# Patient Record
Sex: Female | Born: 1975 | Race: Black or African American | Hispanic: No | Marital: Single | Smoking: Never smoker
Health system: Southern US, Community
[De-identification: ages and names within clinical notes are randomized; demographics above are authoritative.]

## PROBLEM LIST (undated history)

## (undated) ENCOUNTER — Emergency Department (HOSPITAL_COMMUNITY): Discharge status: Against Medical Advice | Payer: Self-pay

## (undated) DIAGNOSIS — I10 Essential (primary) hypertension: Secondary | ICD-10-CM

## (undated) HISTORY — DX: Essential (primary) hypertension: I10

## (undated) HISTORY — PX: TMJ ARTHROPLASTY: SHX1066

---

## 1999-07-25 ENCOUNTER — Encounter: Payer: Self-pay | Admitting: *Deleted

## 1999-07-25 ENCOUNTER — Encounter: Admission: RE | Admit: 1999-07-25 | Discharge: 1999-07-25 | Payer: Self-pay | Admitting: *Deleted

## 2001-03-14 ENCOUNTER — Other Ambulatory Visit: Admission: RE | Admit: 2001-03-14 | Discharge: 2001-03-14 | Payer: Self-pay | Admitting: Obstetrics and Gynecology

## 2003-08-30 ENCOUNTER — Emergency Department (HOSPITAL_COMMUNITY): Admission: EM | Admit: 2003-08-30 | Discharge: 2003-08-30 | Payer: Self-pay | Admitting: Emergency Medicine

## 2004-09-30 ENCOUNTER — Ambulatory Visit (HOSPITAL_COMMUNITY): Admission: RE | Admit: 2004-09-30 | Discharge: 2004-09-30 | Payer: Self-pay | Admitting: Obstetrics and Gynecology

## 2005-05-19 ENCOUNTER — Inpatient Hospital Stay (HOSPITAL_COMMUNITY): Admission: AD | Admit: 2005-05-19 | Discharge: 2005-05-22 | Payer: Self-pay | Admitting: Obstetrics & Gynecology

## 2005-05-23 ENCOUNTER — Encounter: Admission: RE | Admit: 2005-05-23 | Discharge: 2005-06-21 | Payer: Self-pay | Admitting: Obstetrics and Gynecology

## 2005-06-22 ENCOUNTER — Encounter: Admission: RE | Admit: 2005-06-22 | Discharge: 2005-07-22 | Payer: Self-pay | Admitting: Obstetrics and Gynecology

## 2005-07-23 ENCOUNTER — Encounter: Admission: RE | Admit: 2005-07-23 | Discharge: 2005-08-21 | Payer: Self-pay | Admitting: Obstetrics and Gynecology

## 2005-08-22 ENCOUNTER — Encounter: Admission: RE | Admit: 2005-08-22 | Discharge: 2005-09-21 | Payer: Self-pay | Admitting: Obstetrics and Gynecology

## 2005-09-22 ENCOUNTER — Encounter: Admission: RE | Admit: 2005-09-22 | Discharge: 2005-10-22 | Payer: Self-pay | Admitting: Obstetrics and Gynecology

## 2005-10-23 ENCOUNTER — Encounter: Admission: RE | Admit: 2005-10-23 | Discharge: 2005-11-19 | Payer: Self-pay | Admitting: Obstetrics and Gynecology

## 2005-11-20 ENCOUNTER — Encounter: Admission: RE | Admit: 2005-11-20 | Discharge: 2005-12-20 | Payer: Self-pay | Admitting: Obstetrics and Gynecology

## 2005-12-21 ENCOUNTER — Encounter: Admission: RE | Admit: 2005-12-21 | Discharge: 2006-01-19 | Payer: Self-pay | Admitting: Obstetrics and Gynecology

## 2006-01-20 ENCOUNTER — Encounter: Admission: RE | Admit: 2006-01-20 | Discharge: 2006-02-19 | Payer: Self-pay | Admitting: Obstetrics and Gynecology

## 2006-02-20 ENCOUNTER — Encounter: Admission: RE | Admit: 2006-02-20 | Discharge: 2006-03-21 | Payer: Self-pay | Admitting: Obstetrics and Gynecology

## 2006-03-22 ENCOUNTER — Encounter: Admission: RE | Admit: 2006-03-22 | Discharge: 2006-04-21 | Payer: Self-pay | Admitting: Obstetrics and Gynecology

## 2006-04-22 ENCOUNTER — Encounter: Admission: RE | Admit: 2006-04-22 | Discharge: 2006-05-22 | Payer: Self-pay | Admitting: Obstetrics and Gynecology

## 2006-12-02 IMAGING — US US OB COMP LESS 14 WK
1 series · 18 of 28 positions shown · non-contrast
Comparison: none

CLINICAL DATA: 7 week 2 day gestational age by LMP.  Persistent vaginal bleeding.  
 OBSTETRICAL ULTRASOUND WITH TRANSVAGINAL:
 A single living intrauterine gestation is seen with measured heart rate of 118.  Embryonic crown rump length measures 6 mm, corresponding with a gestational age of 6 weeks 3 days.  A normal appearing yolk sac is seen.  A moderate sized subchorionic hemorrhage is seen along the left lateral aspect of the gestational sac.  No fibroids or other uterine abnormalities are seen.
 No adnexal masses or free fluid are identified by either transabdominal or transvaginal sonography.  The left ovary is normal in appearance.  The right ovary contains a corpus luteum cyst measuring approximately 1.7 cm.

[Series 1: us ob comp<14 wk · 18 of 31 slices shown]
[im 1/31]
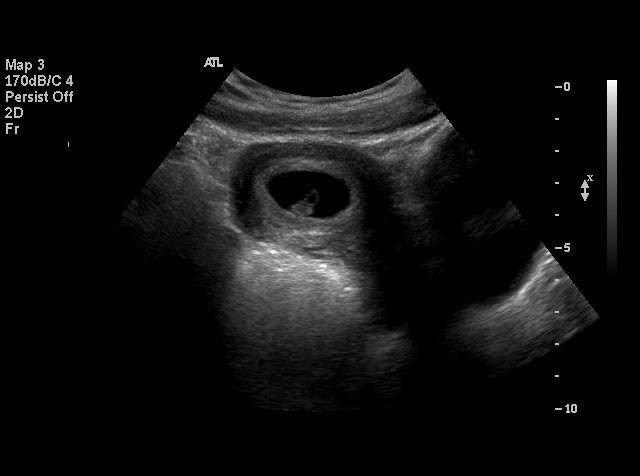
[im 3/31]
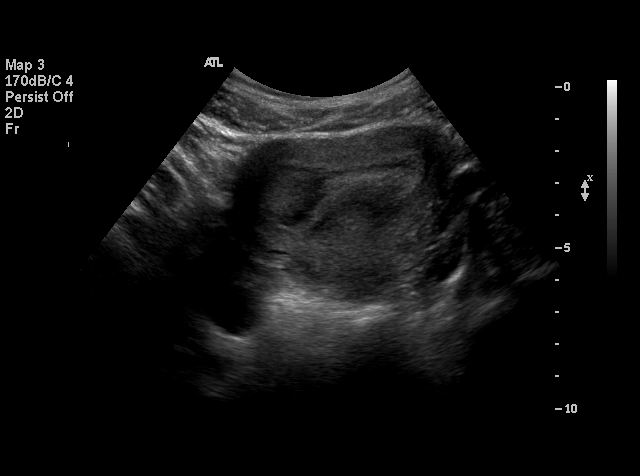
[im 4/31]
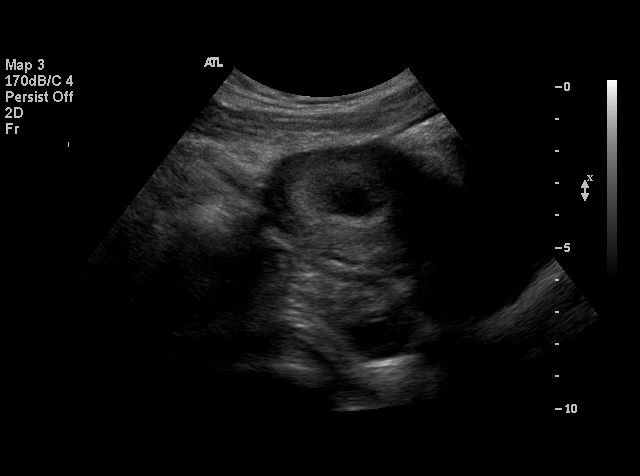
[im 6/31]
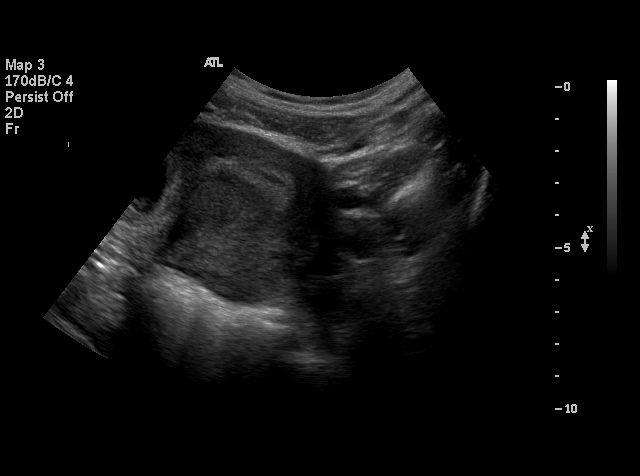
[im 8/31]
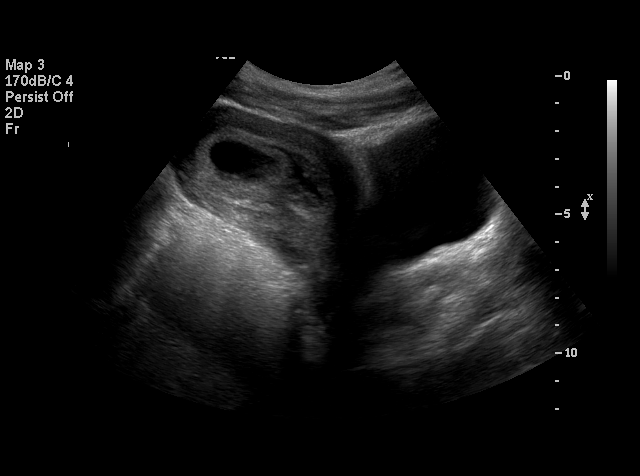
[im 9/31]
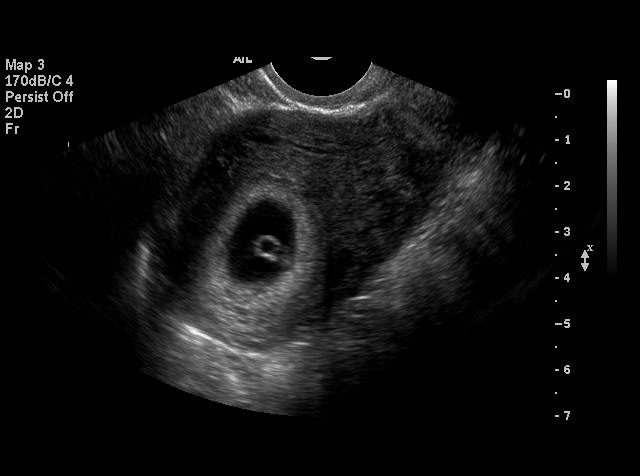
[im 12/31]
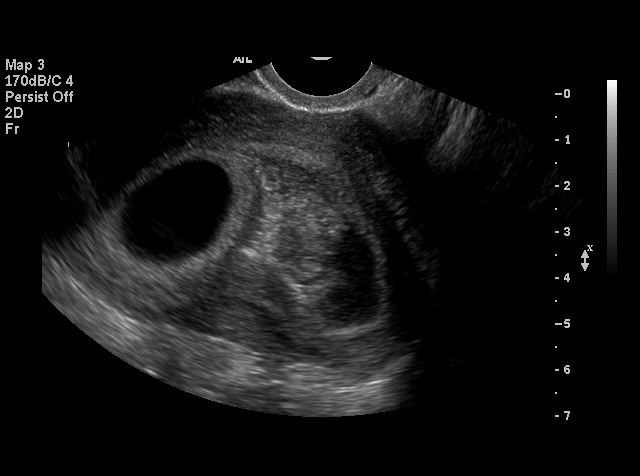
[im 13/31]
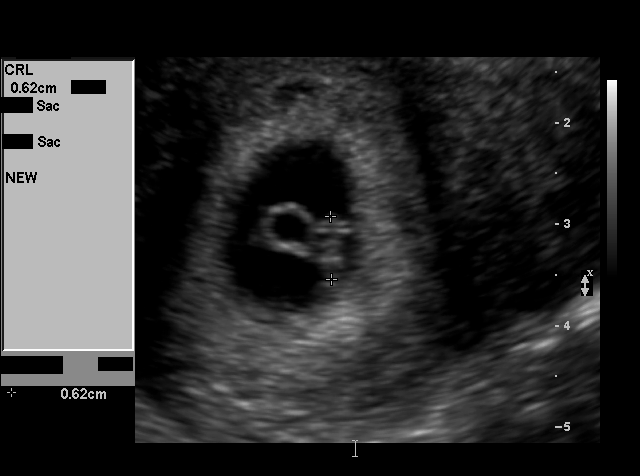
[im 15/31]
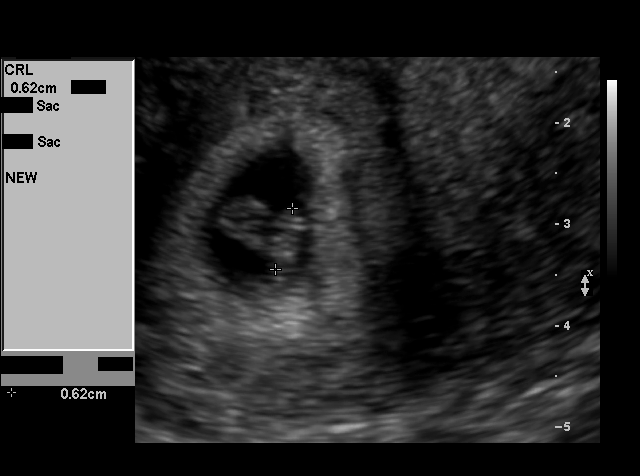
[im 16/31]
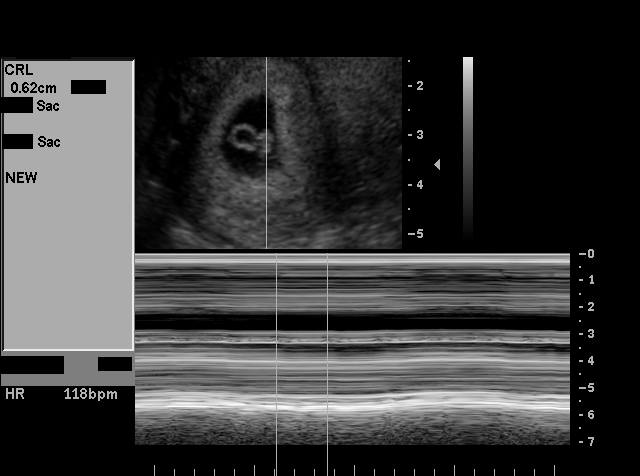
[im 18/31]
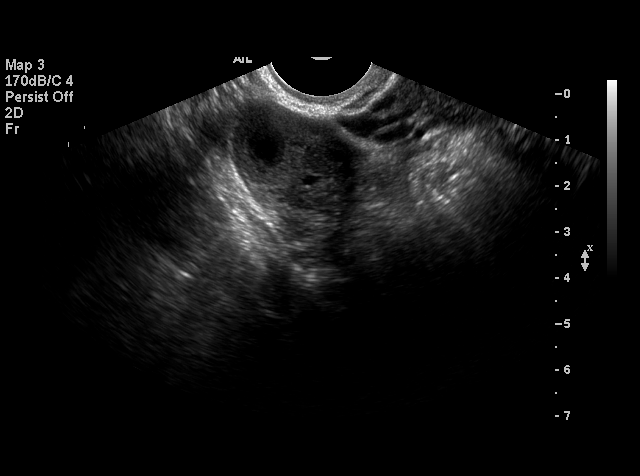
[im 19/31]
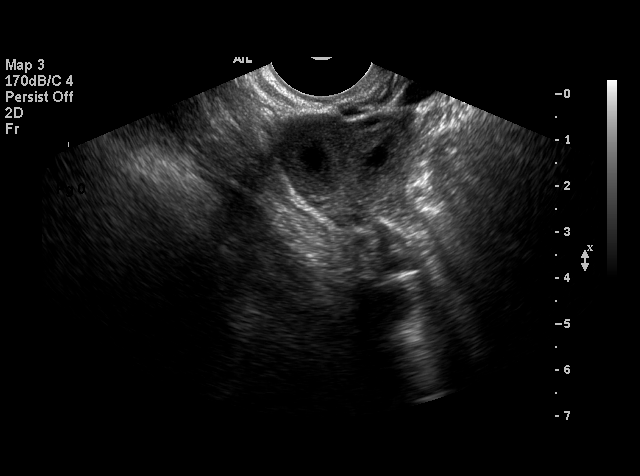
[im 22/31]
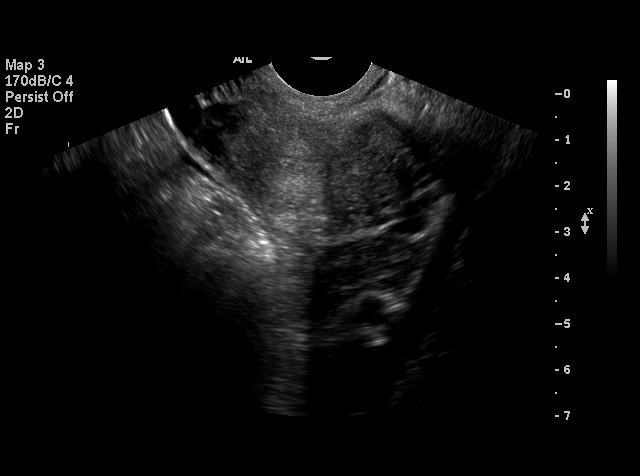
[im 24/31]
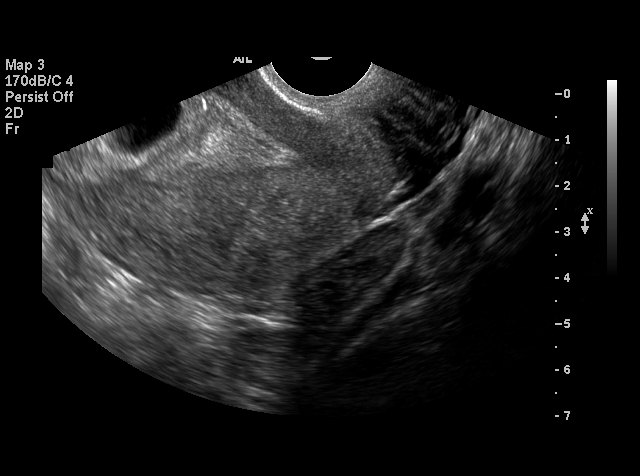
[im 25/31]
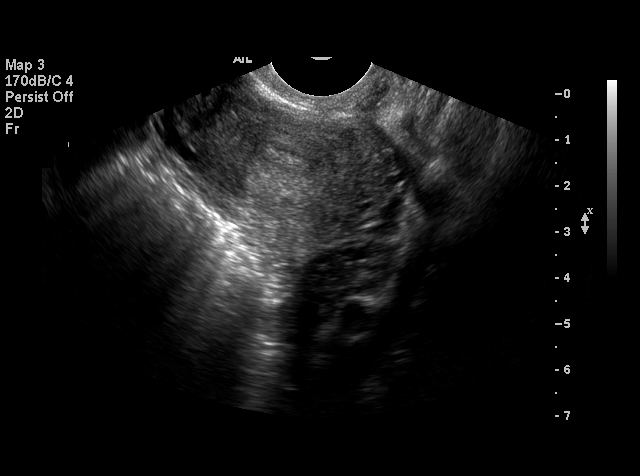
[im 27/31]
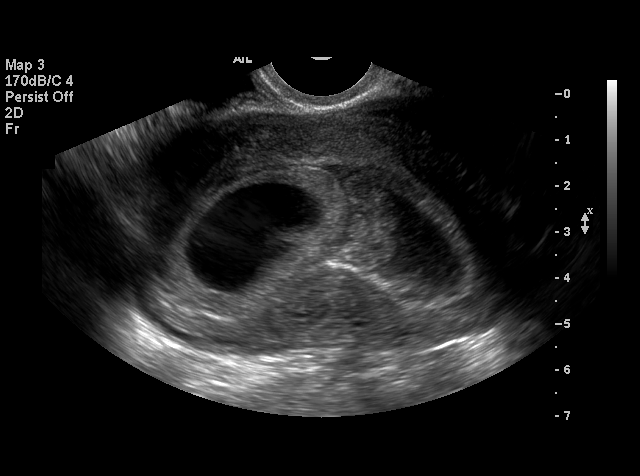
[im 28/31]
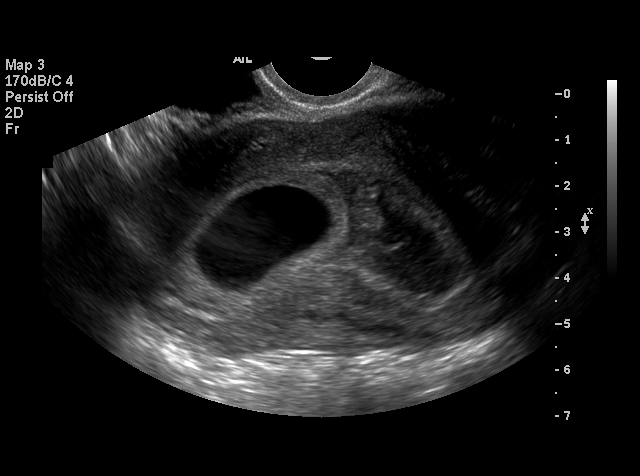
[im 31/31]
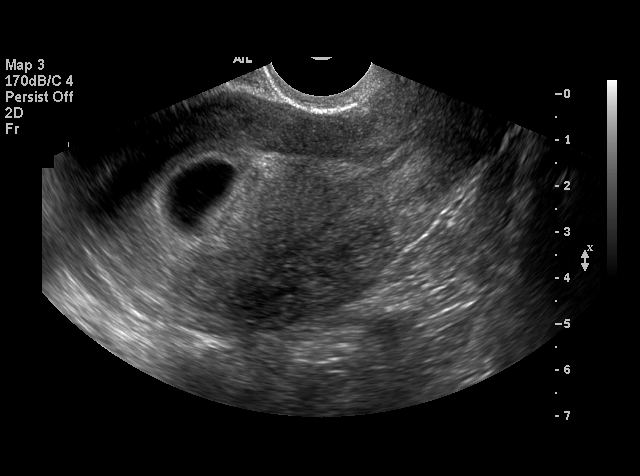

[18 of 28 positions shown; findings below may reference images not displayed]

IMPRESSION: 1.  Single living intrauterine gestation with estimated gestational age of 6 weeks 3 days and sonographic EDC of 05/23/05.  This is within one week of LMP.
 2.  Moderate subchorionic hemorrhage noted.

## 2020-06-03 ENCOUNTER — Other Ambulatory Visit: Payer: Self-pay

## 2020-06-03 ENCOUNTER — Ambulatory Visit
Admission: RE | Admit: 2020-06-03 | Discharge: 2020-06-03 | Disposition: A | Discharge status: Home / Self Care | Payer: No Typology Code available for payment source | Source: Ambulatory Visit | Attending: Obstetrics and Gynecology | Admitting: Obstetrics and Gynecology

## 2020-06-03 ENCOUNTER — Other Ambulatory Visit: Payer: Self-pay | Admitting: Obstetrics and Gynecology

## 2020-06-03 DIAGNOSIS — Z111 Encounter for screening for respiratory tuberculosis: Secondary | ICD-10-CM

## 2020-07-06 DIAGNOSIS — I10 Essential (primary) hypertension: Secondary | ICD-10-CM | POA: Diagnosis not present

## 2020-07-06 DIAGNOSIS — N951 Menopausal and female climacteric states: Secondary | ICD-10-CM | POA: Diagnosis not present

## 2020-07-16 DIAGNOSIS — Z6841 Body Mass Index (BMI) 40.0 and over, adult: Secondary | ICD-10-CM | POA: Diagnosis not present

## 2020-07-16 DIAGNOSIS — N951 Menopausal and female climacteric states: Secondary | ICD-10-CM | POA: Diagnosis not present

## 2020-07-16 DIAGNOSIS — I10 Essential (primary) hypertension: Secondary | ICD-10-CM | POA: Diagnosis not present

## 2020-07-16 DIAGNOSIS — Z1339 Encounter for screening examination for other mental health and behavioral disorders: Secondary | ICD-10-CM | POA: Diagnosis not present

## 2020-07-16 DIAGNOSIS — Z1331 Encounter for screening for depression: Secondary | ICD-10-CM | POA: Diagnosis not present

## 2020-07-23 DIAGNOSIS — I10 Essential (primary) hypertension: Secondary | ICD-10-CM | POA: Diagnosis not present

## 2020-07-23 DIAGNOSIS — J3081 Allergic rhinitis due to animal (cat) (dog) hair and dander: Secondary | ICD-10-CM | POA: Diagnosis not present

## 2020-07-23 DIAGNOSIS — Z6841 Body Mass Index (BMI) 40.0 and over, adult: Secondary | ICD-10-CM | POA: Diagnosis not present

## 2020-07-23 DIAGNOSIS — J301 Allergic rhinitis due to pollen: Secondary | ICD-10-CM | POA: Diagnosis not present

## 2020-07-23 DIAGNOSIS — J3089 Other allergic rhinitis: Secondary | ICD-10-CM | POA: Diagnosis not present

## 2020-07-30 DIAGNOSIS — I10 Essential (primary) hypertension: Secondary | ICD-10-CM | POA: Diagnosis not present

## 2020-07-30 DIAGNOSIS — J3089 Other allergic rhinitis: Secondary | ICD-10-CM | POA: Diagnosis not present

## 2020-07-30 DIAGNOSIS — J301 Allergic rhinitis due to pollen: Secondary | ICD-10-CM | POA: Diagnosis not present

## 2020-07-30 DIAGNOSIS — J3081 Allergic rhinitis due to animal (cat) (dog) hair and dander: Secondary | ICD-10-CM | POA: Diagnosis not present

## 2020-07-30 DIAGNOSIS — Z6841 Body Mass Index (BMI) 40.0 and over, adult: Secondary | ICD-10-CM | POA: Diagnosis not present

## 2020-08-06 DIAGNOSIS — Z6841 Body Mass Index (BMI) 40.0 and over, adult: Secondary | ICD-10-CM | POA: Diagnosis not present

## 2020-08-06 DIAGNOSIS — I10 Essential (primary) hypertension: Secondary | ICD-10-CM | POA: Diagnosis not present

## 2020-08-11 DIAGNOSIS — J3089 Other allergic rhinitis: Secondary | ICD-10-CM | POA: Diagnosis not present

## 2020-08-11 DIAGNOSIS — Z6841 Body Mass Index (BMI) 40.0 and over, adult: Secondary | ICD-10-CM | POA: Diagnosis not present

## 2020-08-11 DIAGNOSIS — I10 Essential (primary) hypertension: Secondary | ICD-10-CM | POA: Diagnosis not present

## 2020-08-11 DIAGNOSIS — J3081 Allergic rhinitis due to animal (cat) (dog) hair and dander: Secondary | ICD-10-CM | POA: Diagnosis not present

## 2020-08-11 DIAGNOSIS — J301 Allergic rhinitis due to pollen: Secondary | ICD-10-CM | POA: Diagnosis not present

## 2020-08-19 DIAGNOSIS — J3089 Other allergic rhinitis: Secondary | ICD-10-CM | POA: Diagnosis not present

## 2020-08-19 DIAGNOSIS — J3081 Allergic rhinitis due to animal (cat) (dog) hair and dander: Secondary | ICD-10-CM | POA: Diagnosis not present

## 2020-08-19 DIAGNOSIS — J301 Allergic rhinitis due to pollen: Secondary | ICD-10-CM | POA: Diagnosis not present

## 2020-08-26 DIAGNOSIS — J301 Allergic rhinitis due to pollen: Secondary | ICD-10-CM | POA: Diagnosis not present

## 2020-08-26 DIAGNOSIS — J3081 Allergic rhinitis due to animal (cat) (dog) hair and dander: Secondary | ICD-10-CM | POA: Diagnosis not present

## 2020-08-26 DIAGNOSIS — J3089 Other allergic rhinitis: Secondary | ICD-10-CM | POA: Diagnosis not present

## 2020-08-27 DIAGNOSIS — N951 Menopausal and female climacteric states: Secondary | ICD-10-CM | POA: Diagnosis not present

## 2020-08-27 DIAGNOSIS — I10 Essential (primary) hypertension: Secondary | ICD-10-CM | POA: Diagnosis not present

## 2020-09-03 DIAGNOSIS — I10 Essential (primary) hypertension: Secondary | ICD-10-CM | POA: Diagnosis not present

## 2020-09-03 DIAGNOSIS — N951 Menopausal and female climacteric states: Secondary | ICD-10-CM | POA: Diagnosis not present

## 2020-09-09 DIAGNOSIS — J301 Allergic rhinitis due to pollen: Secondary | ICD-10-CM | POA: Diagnosis not present

## 2020-09-09 DIAGNOSIS — J3089 Other allergic rhinitis: Secondary | ICD-10-CM | POA: Diagnosis not present

## 2020-09-09 DIAGNOSIS — N951 Menopausal and female climacteric states: Secondary | ICD-10-CM | POA: Diagnosis not present

## 2020-09-09 DIAGNOSIS — J3081 Allergic rhinitis due to animal (cat) (dog) hair and dander: Secondary | ICD-10-CM | POA: Diagnosis not present

## 2020-09-09 DIAGNOSIS — I10 Essential (primary) hypertension: Secondary | ICD-10-CM | POA: Diagnosis not present

## 2020-09-16 DIAGNOSIS — J3081 Allergic rhinitis due to animal (cat) (dog) hair and dander: Secondary | ICD-10-CM | POA: Diagnosis not present

## 2020-09-16 DIAGNOSIS — J301 Allergic rhinitis due to pollen: Secondary | ICD-10-CM | POA: Diagnosis not present

## 2020-09-16 DIAGNOSIS — J3089 Other allergic rhinitis: Secondary | ICD-10-CM | POA: Diagnosis not present

## 2020-09-24 DIAGNOSIS — I10 Essential (primary) hypertension: Secondary | ICD-10-CM | POA: Diagnosis not present

## 2020-09-24 DIAGNOSIS — J3089 Other allergic rhinitis: Secondary | ICD-10-CM | POA: Diagnosis not present

## 2020-09-24 DIAGNOSIS — J3081 Allergic rhinitis due to animal (cat) (dog) hair and dander: Secondary | ICD-10-CM | POA: Diagnosis not present

## 2020-09-24 DIAGNOSIS — Z6841 Body Mass Index (BMI) 40.0 and over, adult: Secondary | ICD-10-CM | POA: Diagnosis not present

## 2020-09-24 DIAGNOSIS — J301 Allergic rhinitis due to pollen: Secondary | ICD-10-CM | POA: Diagnosis not present

## 2020-10-01 DIAGNOSIS — J301 Allergic rhinitis due to pollen: Secondary | ICD-10-CM | POA: Diagnosis not present

## 2020-10-01 DIAGNOSIS — J3089 Other allergic rhinitis: Secondary | ICD-10-CM | POA: Diagnosis not present

## 2020-10-01 DIAGNOSIS — J3081 Allergic rhinitis due to animal (cat) (dog) hair and dander: Secondary | ICD-10-CM | POA: Diagnosis not present

## 2020-10-15 DIAGNOSIS — J3089 Other allergic rhinitis: Secondary | ICD-10-CM | POA: Diagnosis not present

## 2020-10-15 DIAGNOSIS — J3081 Allergic rhinitis due to animal (cat) (dog) hair and dander: Secondary | ICD-10-CM | POA: Diagnosis not present

## 2020-10-15 DIAGNOSIS — J301 Allergic rhinitis due to pollen: Secondary | ICD-10-CM | POA: Diagnosis not present

## 2020-10-29 DIAGNOSIS — J301 Allergic rhinitis due to pollen: Secondary | ICD-10-CM | POA: Diagnosis not present

## 2020-10-29 DIAGNOSIS — J3089 Other allergic rhinitis: Secondary | ICD-10-CM | POA: Diagnosis not present

## 2020-10-29 DIAGNOSIS — J3081 Allergic rhinitis due to animal (cat) (dog) hair and dander: Secondary | ICD-10-CM | POA: Diagnosis not present

## 2020-11-03 DIAGNOSIS — J3089 Other allergic rhinitis: Secondary | ICD-10-CM | POA: Diagnosis not present

## 2020-11-03 DIAGNOSIS — J301 Allergic rhinitis due to pollen: Secondary | ICD-10-CM | POA: Diagnosis not present

## 2020-11-03 DIAGNOSIS — J3081 Allergic rhinitis due to animal (cat) (dog) hair and dander: Secondary | ICD-10-CM | POA: Diagnosis not present

## 2020-11-12 DIAGNOSIS — J3089 Other allergic rhinitis: Secondary | ICD-10-CM | POA: Diagnosis not present

## 2020-11-12 DIAGNOSIS — J301 Allergic rhinitis due to pollen: Secondary | ICD-10-CM | POA: Diagnosis not present

## 2020-11-12 DIAGNOSIS — J3081 Allergic rhinitis due to animal (cat) (dog) hair and dander: Secondary | ICD-10-CM | POA: Diagnosis not present

## 2020-11-25 DIAGNOSIS — J3089 Other allergic rhinitis: Secondary | ICD-10-CM | POA: Diagnosis not present

## 2020-11-25 DIAGNOSIS — J301 Allergic rhinitis due to pollen: Secondary | ICD-10-CM | POA: Diagnosis not present

## 2020-11-25 DIAGNOSIS — J3081 Allergic rhinitis due to animal (cat) (dog) hair and dander: Secondary | ICD-10-CM | POA: Diagnosis not present

## 2020-12-02 DIAGNOSIS — J3089 Other allergic rhinitis: Secondary | ICD-10-CM | POA: Diagnosis not present

## 2020-12-02 DIAGNOSIS — J301 Allergic rhinitis due to pollen: Secondary | ICD-10-CM | POA: Diagnosis not present

## 2020-12-02 DIAGNOSIS — J3081 Allergic rhinitis due to animal (cat) (dog) hair and dander: Secondary | ICD-10-CM | POA: Diagnosis not present

## 2020-12-17 DIAGNOSIS — J301 Allergic rhinitis due to pollen: Secondary | ICD-10-CM | POA: Diagnosis not present

## 2020-12-17 DIAGNOSIS — J3089 Other allergic rhinitis: Secondary | ICD-10-CM | POA: Diagnosis not present

## 2020-12-17 DIAGNOSIS — J3081 Allergic rhinitis due to animal (cat) (dog) hair and dander: Secondary | ICD-10-CM | POA: Diagnosis not present

## 2020-12-24 DIAGNOSIS — J3081 Allergic rhinitis due to animal (cat) (dog) hair and dander: Secondary | ICD-10-CM | POA: Diagnosis not present

## 2020-12-24 DIAGNOSIS — J301 Allergic rhinitis due to pollen: Secondary | ICD-10-CM | POA: Diagnosis not present

## 2020-12-24 DIAGNOSIS — J3089 Other allergic rhinitis: Secondary | ICD-10-CM | POA: Diagnosis not present

## 2021-01-05 DIAGNOSIS — J3081 Allergic rhinitis due to animal (cat) (dog) hair and dander: Secondary | ICD-10-CM | POA: Diagnosis not present

## 2021-01-05 DIAGNOSIS — J301 Allergic rhinitis due to pollen: Secondary | ICD-10-CM | POA: Diagnosis not present

## 2021-01-05 DIAGNOSIS — J3089 Other allergic rhinitis: Secondary | ICD-10-CM | POA: Diagnosis not present

## 2021-01-05 DIAGNOSIS — L500 Allergic urticaria: Secondary | ICD-10-CM | POA: Diagnosis not present

## 2021-01-19 DIAGNOSIS — J301 Allergic rhinitis due to pollen: Secondary | ICD-10-CM | POA: Diagnosis not present

## 2021-01-19 DIAGNOSIS — J3081 Allergic rhinitis due to animal (cat) (dog) hair and dander: Secondary | ICD-10-CM | POA: Diagnosis not present

## 2021-01-20 DIAGNOSIS — J3089 Other allergic rhinitis: Secondary | ICD-10-CM | POA: Diagnosis not present

## 2021-01-21 DIAGNOSIS — J3081 Allergic rhinitis due to animal (cat) (dog) hair and dander: Secondary | ICD-10-CM | POA: Diagnosis not present

## 2021-01-21 DIAGNOSIS — J301 Allergic rhinitis due to pollen: Secondary | ICD-10-CM | POA: Diagnosis not present

## 2021-01-21 DIAGNOSIS — J3089 Other allergic rhinitis: Secondary | ICD-10-CM | POA: Diagnosis not present

## 2021-03-02 DIAGNOSIS — J3081 Allergic rhinitis due to animal (cat) (dog) hair and dander: Secondary | ICD-10-CM | POA: Diagnosis not present

## 2021-03-02 DIAGNOSIS — J301 Allergic rhinitis due to pollen: Secondary | ICD-10-CM | POA: Diagnosis not present

## 2021-03-02 DIAGNOSIS — J3089 Other allergic rhinitis: Secondary | ICD-10-CM | POA: Diagnosis not present

## 2021-03-09 DIAGNOSIS — J3081 Allergic rhinitis due to animal (cat) (dog) hair and dander: Secondary | ICD-10-CM | POA: Diagnosis not present

## 2021-03-09 DIAGNOSIS — J301 Allergic rhinitis due to pollen: Secondary | ICD-10-CM | POA: Diagnosis not present

## 2021-03-09 DIAGNOSIS — J3089 Other allergic rhinitis: Secondary | ICD-10-CM | POA: Diagnosis not present

## 2021-03-16 DIAGNOSIS — J3089 Other allergic rhinitis: Secondary | ICD-10-CM | POA: Diagnosis not present

## 2021-03-16 DIAGNOSIS — J3081 Allergic rhinitis due to animal (cat) (dog) hair and dander: Secondary | ICD-10-CM | POA: Diagnosis not present

## 2021-03-16 DIAGNOSIS — J301 Allergic rhinitis due to pollen: Secondary | ICD-10-CM | POA: Diagnosis not present

## 2021-04-08 DIAGNOSIS — J3089 Other allergic rhinitis: Secondary | ICD-10-CM | POA: Diagnosis not present

## 2021-04-08 DIAGNOSIS — J301 Allergic rhinitis due to pollen: Secondary | ICD-10-CM | POA: Diagnosis not present

## 2021-04-08 DIAGNOSIS — J3081 Allergic rhinitis due to animal (cat) (dog) hair and dander: Secondary | ICD-10-CM | POA: Diagnosis not present

## 2021-04-14 DIAGNOSIS — J301 Allergic rhinitis due to pollen: Secondary | ICD-10-CM | POA: Diagnosis not present

## 2021-04-14 DIAGNOSIS — J3089 Other allergic rhinitis: Secondary | ICD-10-CM | POA: Diagnosis not present

## 2021-04-14 DIAGNOSIS — J3081 Allergic rhinitis due to animal (cat) (dog) hair and dander: Secondary | ICD-10-CM | POA: Diagnosis not present

## 2021-04-25 DIAGNOSIS — J3081 Allergic rhinitis due to animal (cat) (dog) hair and dander: Secondary | ICD-10-CM | POA: Diagnosis not present

## 2021-04-25 DIAGNOSIS — J301 Allergic rhinitis due to pollen: Secondary | ICD-10-CM | POA: Diagnosis not present

## 2021-04-25 DIAGNOSIS — J3089 Other allergic rhinitis: Secondary | ICD-10-CM | POA: Diagnosis not present

## 2021-05-02 DIAGNOSIS — J3089 Other allergic rhinitis: Secondary | ICD-10-CM | POA: Diagnosis not present

## 2021-05-02 DIAGNOSIS — J301 Allergic rhinitis due to pollen: Secondary | ICD-10-CM | POA: Diagnosis not present

## 2021-05-02 DIAGNOSIS — J3081 Allergic rhinitis due to animal (cat) (dog) hair and dander: Secondary | ICD-10-CM | POA: Diagnosis not present

## 2021-05-18 DIAGNOSIS — J301 Allergic rhinitis due to pollen: Secondary | ICD-10-CM | POA: Diagnosis not present

## 2021-05-18 DIAGNOSIS — J3089 Other allergic rhinitis: Secondary | ICD-10-CM | POA: Diagnosis not present

## 2021-05-18 DIAGNOSIS — J3081 Allergic rhinitis due to animal (cat) (dog) hair and dander: Secondary | ICD-10-CM | POA: Diagnosis not present

## 2021-05-25 DIAGNOSIS — J3081 Allergic rhinitis due to animal (cat) (dog) hair and dander: Secondary | ICD-10-CM | POA: Diagnosis not present

## 2021-05-25 DIAGNOSIS — J301 Allergic rhinitis due to pollen: Secondary | ICD-10-CM | POA: Diagnosis not present

## 2021-05-25 DIAGNOSIS — J3089 Other allergic rhinitis: Secondary | ICD-10-CM | POA: Diagnosis not present

## 2021-06-13 DIAGNOSIS — J3081 Allergic rhinitis due to animal (cat) (dog) hair and dander: Secondary | ICD-10-CM | POA: Diagnosis not present

## 2021-06-13 DIAGNOSIS — J301 Allergic rhinitis due to pollen: Secondary | ICD-10-CM | POA: Diagnosis not present

## 2021-06-13 DIAGNOSIS — Z1231 Encounter for screening mammogram for malignant neoplasm of breast: Secondary | ICD-10-CM | POA: Diagnosis not present

## 2021-06-13 DIAGNOSIS — J3089 Other allergic rhinitis: Secondary | ICD-10-CM | POA: Diagnosis not present

## 2021-06-16 DIAGNOSIS — Z01419 Encounter for gynecological examination (general) (routine) without abnormal findings: Secondary | ICD-10-CM | POA: Diagnosis not present

## 2021-06-16 DIAGNOSIS — Z30431 Encounter for routine checking of intrauterine contraceptive device: Secondary | ICD-10-CM | POA: Diagnosis not present

## 2021-06-16 DIAGNOSIS — Z30014 Encounter for initial prescription of intrauterine contraceptive device: Secondary | ICD-10-CM | POA: Diagnosis not present

## 2021-06-16 DIAGNOSIS — I10 Essential (primary) hypertension: Secondary | ICD-10-CM | POA: Diagnosis not present

## 2021-06-20 DIAGNOSIS — J3089 Other allergic rhinitis: Secondary | ICD-10-CM | POA: Diagnosis not present

## 2021-06-20 DIAGNOSIS — J301 Allergic rhinitis due to pollen: Secondary | ICD-10-CM | POA: Diagnosis not present

## 2021-06-20 DIAGNOSIS — J3081 Allergic rhinitis due to animal (cat) (dog) hair and dander: Secondary | ICD-10-CM | POA: Diagnosis not present

## 2021-06-29 DIAGNOSIS — T8332XA Displacement of intrauterine contraceptive device, initial encounter: Secondary | ICD-10-CM | POA: Diagnosis not present

## 2021-06-29 DIAGNOSIS — Z30433 Encounter for removal and reinsertion of intrauterine contraceptive device: Secondary | ICD-10-CM | POA: Diagnosis not present

## 2021-06-29 DIAGNOSIS — D251 Intramural leiomyoma of uterus: Secondary | ICD-10-CM | POA: Diagnosis not present

## 2021-07-06 DIAGNOSIS — J3089 Other allergic rhinitis: Secondary | ICD-10-CM | POA: Diagnosis not present

## 2021-07-06 DIAGNOSIS — J301 Allergic rhinitis due to pollen: Secondary | ICD-10-CM | POA: Diagnosis not present

## 2021-07-06 DIAGNOSIS — J3081 Allergic rhinitis due to animal (cat) (dog) hair and dander: Secondary | ICD-10-CM | POA: Diagnosis not present

## 2021-07-13 DIAGNOSIS — J3081 Allergic rhinitis due to animal (cat) (dog) hair and dander: Secondary | ICD-10-CM | POA: Diagnosis not present

## 2021-07-13 DIAGNOSIS — J301 Allergic rhinitis due to pollen: Secondary | ICD-10-CM | POA: Diagnosis not present

## 2021-07-13 DIAGNOSIS — J3089 Other allergic rhinitis: Secondary | ICD-10-CM | POA: Diagnosis not present

## 2021-07-18 DIAGNOSIS — R252 Cramp and spasm: Secondary | ICD-10-CM | POA: Diagnosis not present

## 2021-07-18 DIAGNOSIS — R000 Tachycardia, unspecified: Secondary | ICD-10-CM | POA: Diagnosis not present

## 2021-07-18 DIAGNOSIS — Z13220 Encounter for screening for lipoid disorders: Secondary | ICD-10-CM | POA: Diagnosis not present

## 2021-07-18 DIAGNOSIS — I10 Essential (primary) hypertension: Secondary | ICD-10-CM | POA: Diagnosis not present

## 2021-07-18 DIAGNOSIS — Z23 Encounter for immunization: Secondary | ICD-10-CM | POA: Diagnosis not present

## 2021-07-29 DIAGNOSIS — J301 Allergic rhinitis due to pollen: Secondary | ICD-10-CM | POA: Diagnosis not present

## 2021-07-29 DIAGNOSIS — J3089 Other allergic rhinitis: Secondary | ICD-10-CM | POA: Diagnosis not present

## 2021-07-29 DIAGNOSIS — J3081 Allergic rhinitis due to animal (cat) (dog) hair and dander: Secondary | ICD-10-CM | POA: Diagnosis not present

## 2021-08-04 DIAGNOSIS — Z30431 Encounter for routine checking of intrauterine contraceptive device: Secondary | ICD-10-CM | POA: Diagnosis not present

## 2021-08-04 DIAGNOSIS — I10 Essential (primary) hypertension: Secondary | ICD-10-CM | POA: Diagnosis not present

## 2021-08-17 DIAGNOSIS — J3081 Allergic rhinitis due to animal (cat) (dog) hair and dander: Secondary | ICD-10-CM | POA: Diagnosis not present

## 2021-08-17 DIAGNOSIS — J3089 Other allergic rhinitis: Secondary | ICD-10-CM | POA: Diagnosis not present

## 2021-08-17 DIAGNOSIS — J301 Allergic rhinitis due to pollen: Secondary | ICD-10-CM | POA: Diagnosis not present

## 2021-08-26 DIAGNOSIS — J3081 Allergic rhinitis due to animal (cat) (dog) hair and dander: Secondary | ICD-10-CM | POA: Diagnosis not present

## 2021-08-26 DIAGNOSIS — J3089 Other allergic rhinitis: Secondary | ICD-10-CM | POA: Diagnosis not present

## 2021-08-26 DIAGNOSIS — J301 Allergic rhinitis due to pollen: Secondary | ICD-10-CM | POA: Diagnosis not present

## 2021-09-02 DIAGNOSIS — J3081 Allergic rhinitis due to animal (cat) (dog) hair and dander: Secondary | ICD-10-CM | POA: Diagnosis not present

## 2021-09-02 DIAGNOSIS — J3089 Other allergic rhinitis: Secondary | ICD-10-CM | POA: Diagnosis not present

## 2021-09-02 DIAGNOSIS — J301 Allergic rhinitis due to pollen: Secondary | ICD-10-CM | POA: Diagnosis not present

## 2021-09-16 DIAGNOSIS — J3081 Allergic rhinitis due to animal (cat) (dog) hair and dander: Secondary | ICD-10-CM | POA: Diagnosis not present

## 2021-09-16 DIAGNOSIS — J3089 Other allergic rhinitis: Secondary | ICD-10-CM | POA: Diagnosis not present

## 2021-09-16 DIAGNOSIS — J301 Allergic rhinitis due to pollen: Secondary | ICD-10-CM | POA: Diagnosis not present

## 2021-09-23 DIAGNOSIS — J301 Allergic rhinitis due to pollen: Secondary | ICD-10-CM | POA: Diagnosis not present

## 2021-09-23 DIAGNOSIS — J3089 Other allergic rhinitis: Secondary | ICD-10-CM | POA: Diagnosis not present

## 2021-09-23 DIAGNOSIS — J3081 Allergic rhinitis due to animal (cat) (dog) hair and dander: Secondary | ICD-10-CM | POA: Diagnosis not present

## 2021-09-30 DIAGNOSIS — J3089 Other allergic rhinitis: Secondary | ICD-10-CM | POA: Diagnosis not present

## 2021-09-30 DIAGNOSIS — J301 Allergic rhinitis due to pollen: Secondary | ICD-10-CM | POA: Diagnosis not present

## 2021-09-30 DIAGNOSIS — J3081 Allergic rhinitis due to animal (cat) (dog) hair and dander: Secondary | ICD-10-CM | POA: Diagnosis not present

## 2021-10-12 DIAGNOSIS — J301 Allergic rhinitis due to pollen: Secondary | ICD-10-CM | POA: Diagnosis not present

## 2021-10-12 DIAGNOSIS — J3089 Other allergic rhinitis: Secondary | ICD-10-CM | POA: Diagnosis not present

## 2021-10-12 DIAGNOSIS — J3081 Allergic rhinitis due to animal (cat) (dog) hair and dander: Secondary | ICD-10-CM | POA: Diagnosis not present

## 2021-10-20 DIAGNOSIS — J3081 Allergic rhinitis due to animal (cat) (dog) hair and dander: Secondary | ICD-10-CM | POA: Diagnosis not present

## 2021-10-20 DIAGNOSIS — J301 Allergic rhinitis due to pollen: Secondary | ICD-10-CM | POA: Diagnosis not present

## 2021-10-20 DIAGNOSIS — J3089 Other allergic rhinitis: Secondary | ICD-10-CM | POA: Diagnosis not present

## 2021-10-26 ENCOUNTER — Ambulatory Visit (HOSPITAL_BASED_OUTPATIENT_CLINIC_OR_DEPARTMENT_OTHER): Payer: BC Managed Care – PPO | Admitting: Cardiovascular Disease

## 2021-11-07 DIAGNOSIS — J3089 Other allergic rhinitis: Secondary | ICD-10-CM | POA: Diagnosis not present

## 2021-11-07 DIAGNOSIS — J3081 Allergic rhinitis due to animal (cat) (dog) hair and dander: Secondary | ICD-10-CM | POA: Diagnosis not present

## 2021-11-07 DIAGNOSIS — J301 Allergic rhinitis due to pollen: Secondary | ICD-10-CM | POA: Diagnosis not present

## 2021-11-16 DIAGNOSIS — J301 Allergic rhinitis due to pollen: Secondary | ICD-10-CM | POA: Diagnosis not present

## 2021-11-16 DIAGNOSIS — J3081 Allergic rhinitis due to animal (cat) (dog) hair and dander: Secondary | ICD-10-CM | POA: Diagnosis not present

## 2021-11-17 DIAGNOSIS — J3089 Other allergic rhinitis: Secondary | ICD-10-CM | POA: Diagnosis not present

## 2021-11-24 DIAGNOSIS — J301 Allergic rhinitis due to pollen: Secondary | ICD-10-CM | POA: Diagnosis not present

## 2021-11-24 DIAGNOSIS — J3089 Other allergic rhinitis: Secondary | ICD-10-CM | POA: Diagnosis not present

## 2021-11-24 DIAGNOSIS — J3081 Allergic rhinitis due to animal (cat) (dog) hair and dander: Secondary | ICD-10-CM | POA: Diagnosis not present

## 2021-11-28 ENCOUNTER — Ambulatory Visit (HOSPITAL_BASED_OUTPATIENT_CLINIC_OR_DEPARTMENT_OTHER): Payer: BC Managed Care – PPO | Admitting: Cardiovascular Disease

## 2021-12-26 DIAGNOSIS — J3081 Allergic rhinitis due to animal (cat) (dog) hair and dander: Secondary | ICD-10-CM | POA: Diagnosis not present

## 2021-12-26 DIAGNOSIS — J301 Allergic rhinitis due to pollen: Secondary | ICD-10-CM | POA: Diagnosis not present

## 2021-12-26 DIAGNOSIS — J3089 Other allergic rhinitis: Secondary | ICD-10-CM | POA: Diagnosis not present

## 2022-01-16 DIAGNOSIS — I10 Essential (primary) hypertension: Secondary | ICD-10-CM | POA: Diagnosis not present

## 2022-01-16 DIAGNOSIS — J3089 Other allergic rhinitis: Secondary | ICD-10-CM | POA: Diagnosis not present

## 2022-01-16 DIAGNOSIS — Z0000 Encounter for general adult medical examination without abnormal findings: Secondary | ICD-10-CM | POA: Diagnosis not present

## 2022-01-16 DIAGNOSIS — J301 Allergic rhinitis due to pollen: Secondary | ICD-10-CM | POA: Diagnosis not present

## 2022-01-16 DIAGNOSIS — J3081 Allergic rhinitis due to animal (cat) (dog) hair and dander: Secondary | ICD-10-CM | POA: Diagnosis not present

## 2022-01-27 ENCOUNTER — Encounter (HOSPITAL_BASED_OUTPATIENT_CLINIC_OR_DEPARTMENT_OTHER): Payer: Self-pay | Admitting: Cardiovascular Disease

## 2022-01-27 ENCOUNTER — Ambulatory Visit (HOSPITAL_BASED_OUTPATIENT_CLINIC_OR_DEPARTMENT_OTHER): Payer: BC Managed Care – PPO | Admitting: Cardiovascular Disease

## 2022-01-27 VITALS — BP 157/119 | HR 108 | Ht 62.0 in | Wt 254.6 lb

## 2022-01-27 DIAGNOSIS — I10 Essential (primary) hypertension: Secondary | ICD-10-CM

## 2022-01-27 DIAGNOSIS — R000 Tachycardia, unspecified: Secondary | ICD-10-CM

## 2022-01-27 DIAGNOSIS — J3089 Other allergic rhinitis: Secondary | ICD-10-CM | POA: Diagnosis not present

## 2022-01-27 DIAGNOSIS — Z006 Encounter for examination for normal comparison and control in clinical research program: Secondary | ICD-10-CM

## 2022-01-27 DIAGNOSIS — J301 Allergic rhinitis due to pollen: Secondary | ICD-10-CM | POA: Diagnosis not present

## 2022-01-27 DIAGNOSIS — J3081 Allergic rhinitis due to animal (cat) (dog) hair and dander: Secondary | ICD-10-CM | POA: Diagnosis not present

## 2022-01-27 HISTORY — DX: Essential (primary) hypertension: I10

## 2022-01-27 HISTORY — DX: Tachycardia, unspecified: R00.0

## 2022-01-27 HISTORY — DX: Morbid (severe) obesity due to excess calories: E66.01

## 2022-01-27 MED ORDER — HYDROCHLOROTHIAZIDE 12.5 MG PO CAPS: 12.5000 mg | ORAL_CAPSULE | Freq: Every day | ORAL | 3 refills | Status: DC

## 2022-01-27 MED ORDER — AMLODIPINE BESYLATE 5 MG PO TABS: 5.0000 mg | ORAL_TABLET | Freq: Every day | ORAL | 3 refills | Status: DC

## 2022-01-27 NOTE — Progress Notes (Signed)
? ?Advanced Hypertension Clinic Initial Assessment:   ? ?Date:  01/27/2022  ? ?ID:  Alexis Clarke, DOB 1976-04-20, MRN [id number] ? ?PCP:  Christa See, FNP  ?Cardiologist:  None  ?Nephrologist: ? ?Referring MD: Servando Salina, MD  ? ?CC: Hypertension ? ?History of Present Illness:   ? ?Alexis Clarke is a 46 y.o. female with a hx of hypertension here to establish care in the [facility] Hypertension Clinic.  She saw Dr. Garwin Brothers on 07/2021.  At that visit her blood pressure was 160/90 on hydrochlorothiazide.  At home her BP has been 110/80-90s.  She checks her BP first thing in the AM.  In the afternoons it is usually 120/80-90s.  She notes that it is always higher when she checks in the doctor's office.  She has never brought her machine to ensure that it was accurate.  She works out 2-3 days per week for 30-45 minutes per day.  She has no chest pain or shortness of breath.  She gets some swelling her R leg which is chronic.  It improves with elevation.  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  She mostly cooks at home and limits her sodium intake.  On the weekends she eats out more due to her daughters travel volleyball schedule.  She notes that she snores but does not have any apnea or daytime somnolence.  She feels well rested in the mornings.  She notes that she does have to get up to urinate frequently because she started taking 12.5 mg of HCTZ in the afternoons. ? ?Previous antihypertensives: ?HCTZ ? ?Past Medical History:  ?Diagnosis Date  ? Essential hypertension 01/27/2022  ? Hypertension   ? Morbid obesity ([place]) 01/27/2022  ? Sinus tachycardia 01/27/2022  ? ? ?Past Surgical History:  ?Procedure Laterality Date  ? TMJ ARTHROPLASTY    ? ? ?Current Medications: ?Current Meds  ?Medication Sig  ? amLODipine (NORVASC) 5 MG tablet Take 1 tablet (5 mg total) by mouth at bedtime.  ? cetirizine (ZYRTEC) 10 MG tablet Take 1 tablet by mouth as needed.  ?  EPINEPHrine 0.3 mg/0.3 mL IJ SOAJ injection See admin instructions.  ? [DISCONTINUED] hydrochlorothiazide (MICROZIDE) 12.5 MG capsule Take 1 capsule by mouth 2 (two) times daily.  ?  ? ?Allergies:   Almond (diagnostic), Other, and Shellfish-derived products  ? ?Social History  ? ?Socioeconomic History  ? Marital status: Single  ?  Spouse name: Not on file  ? Number of children: Not on file  ? Years of education: Not on file  ? Highest education level: Not on file  ?Occupational History  ? Not on file  ?Tobacco Use  ? Smoking status: Never  ? Smokeless tobacco: Never  ?Substance and Sexual Activity  ? Alcohol use: Never  ? Drug use: Never  ? Sexual activity: Not on file  ?Other Topics Concern  ? Not on file  ?Social History Narrative  ? Not on file  ? ?Social Determinants of Health  ? ?Financial Resource Strain: Low Risk   ? Difficulty of Paying Living Expenses: Not hard at all  ?Food Insecurity: No Food Insecurity  ? Worried About Charity fundraiser in the Last Year: Never true  ? Ran Out of Food in the Last Year: Never true  ?Transportation Needs: No Transportation Needs  ? Lack of Transportation (Medical): No  ? Lack of Transportation (Non-Medical): No  ?Physical Activity: Insufficiently Active  ? Days of Exercise per Week:  3 days  ? Minutes of Exercise per Session: 30 min  ?Stress: Not on file  ?Social Connections: Not on file  ?  ? ?Family History: ?The patient's family history includes Hyperlipidemia in her mother; Hypertension in her father and mother. ? ?ROS:   ?Please see the history of present illness.    ?All other systems reviewed and are negative. ? ?EKGs/Labs/Other Studies Reviewed:   ? ?EKG:  EKG is ordered today.  The ekg ordered today demonstrates sinus tachycardia.  Rate 108 bpm.  Nonspecific T wave abnormalities. ? ?Recent Labs: ?No results found for requested labs within last 8760 hours.  ? ?Recent Lipid Panel ?No results found for: CHOL, TRIG, HDL, CHOLHDL, VLDL, LDLCALC, LDLDIRECT ? ?Physical  Exam:   ?VS:  BP (!) 157/119 (BP Location: Left Arm, Patient Position: Sitting, Cuff Size: Large)   Pulse (!) 108   Ht 5\' 2"  (1.575 m)   Wt 254 lb 9.6 oz (115.5 kg)   BMI 46.57 kg/m?  , BMI Body mass index is 46.57 kg/m?. ?GENERAL:  Well appearing ?HEENT: Pupils equal round and reactive, fundi not visualized, oral mucosa unremarkable ?NECK:  No jugular venous distention, waveform within normal limits, carotid upstroke brisk and symmetric, no bruits, no thyromegaly ?LUNGS:  Clear to auscultation bilaterally ?HEART:  RRR.  PMI not displaced or sustained,S1 and S2 within normal limits, no S3, no S4, no clicks, no rubs, no murmurs ?ABD:  Flat, positive bowel sounds normal in frequency in pitch, no bruits, no rebound, no guarding, no midline pulsatile mass, no hepatomegaly, no splenomegaly ?EXT:  2 plus pulses throughout, no edema, no cyanosis no clubbing ?SKIN:  No rashes no nodules ?NEURO:  Cranial nerves II through XII grossly intact, motor grossly intact throughout ?PSYCH:  Cognitively intact, oriented to person place and time ? ? ?ASSESSMENT/PLAN:   ? ?Essential hypertension ?Blood pressures are labile.  They tend to be higher in the office and lower at home.  I suspect she has whitecoat hypertension superimposed on essential hypertension.  She has to urinate a lot at night due to taking hydrochlorothiazide in the evening.  We will have her keep the 12.5 mg in the morning and add amlodipine 5 mg in the evening.  Her morning blood pressures tend to run pretty low and I am afraid that if we increase the HCTZ to 25 she will become hypotensive.  However her diastolic blood pressures are too high and she needs an additional agent.  She will keep working on her exercise.  The goal is to get to at least 150 minutes weekly.  She was given an advance hypertension clinic educational booklet and asked to track her blood pressures twice daily.  She consents to be monitored in our remote patient monitoring program through  [facility].  she will track his blood pressure twice daily and understands that these trends will help [place] to adjust her medications as needed prior to his next appointment.  We discussed limiting sodium to 2500mg  daily. ? ?Morbid obesity ([place]) ?She struggles to exercise as regularly after she started commuting to work.  She did better when she was working from home.  She is going to work on trying to get back to 150 minutes weekly. ? ?Sinus tachycardia ?Heart rates were initially elevated but improved on repeat after rest.  She reportedly had normal thyroid function with her PCP 2 weeks ago.  She is not anemic and has no evidence of infection. ? ? ?Screening for Secondary Hypertension:  ? ?  01/27/2022  ?  9:18 AM  ?Causes  ?Drugs/Herbals Screened  ?   - Comments limiting sodium.  no caffeine. No EtOH.  No NSAIDS.  ?Sleep Apnea Screened  ?   - Comments snores but no other symtpoms.  ?Thyroid Disease Screened  ?   - Comments normal with PCP 01/2022  ?Hyperaldosteronism N/A  ?Pheochromocytoma N/A  ?Cushing's Syndrome N/A  ?Hyperparathyroidism N/A  ?Coarctation of the Aorta Screened  ?   - Comments BP symmetric  ?Compliance Screened  ?  ?Relevant Labs/Studies: ?   ?   ?   ?   ?   ?   ? ? ?Disposition:    ?FU with MD/PharmD in 1 month  ? ? ?Medication Adjustments/Labs and Tests Ordered: ?Current medicines are reviewed at length with the patient today.  Concerns regarding medicines are outlined above.  ?Orders Placed This Encounter  ?Procedures  ? Cantril?s Ladder Assessment  ? EKG 12-Lead  ? ?Meds ordered this encounter  ?Medications  ? amLODipine (NORVASC) 5 MG tablet  ?  Sig: Take 1 tablet (5 mg total) by mouth at bedtime.  ?  Dispense:  90 tablet  ?  Refill:  3  ? hydrochlorothiazide (MICROZIDE) 12.5 MG capsule  ?  Sig: Take 1 capsule (12.5 mg total) by mouth daily.  ?  Dispense:  90 capsule  ?  Refill:  3  ?  D/C PREVIOUS RX  ? ? ? ?Signed, ?Skeet Latch, MD  ?01/27/2022 12:59 PM    ?[place] ? ?

## 2022-01-27 NOTE — Research (Signed)
?  Subject Name: Alexis Clarke ? ?Alexis Clarke met inclusion and exclusion criteria for the Virtual Care and Social Determinant Interventions for the management of hypertension trial.  The informed consent form, study requirements and expectations were reviewed with the subject by Dr. Oval Linsey and myself. The subject was given the opportunity to read the consent and ask questions. The subject verbalized understanding of the trial requirements.  All questions were addressed prior to the signing of the consent form. The subject agreed to participate in the trial and signed the informed consent. The informed consent was obtained prior to performance of any protocol-specific procedures for the subject.  A copy of the signed informed consent was given to the subject and a copy was placed in the subject's medical record.  ?Alexis Clarke was randomized to Group 1.  ? ?

## 2022-01-27 NOTE — Assessment & Plan Note (Signed)
Heart rates were initially elevated but improved on repeat after rest.  She reportedly had normal thyroid function with her PCP 2 weeks ago.  She is not anemic and has no evidence of infection. ?

## 2022-01-27 NOTE — Assessment & Plan Note (Addendum)
Blood pressures are labile.  They tend to be higher in the office and lower at home.  I suspect she has whitecoat hypertension superimposed on essential hypertension.  She has to urinate a lot at night due to taking hydrochlorothiazide in the evening.  We will have her keep the 12.5 mg in the morning and add amlodipine 5 mg in the evening.  Her morning blood pressures tend to run pretty low and I am afraid that if we increase the HCTZ to 25 she will become hypotensive.  However her diastolic blood pressures are too high and she needs an additional agent.  She will keep working on her exercise.  The goal is to get to at least 150 minutes weekly.  She was given an advance hypertension clinic educational booklet and asked to track her blood pressures twice daily.  She consents to be monitored in our remote patient monitoring program through [employer].  she will track his blood pressure twice daily and understands that these trends will help [place] to adjust her medications as needed prior to his next appointment.  We discussed limiting sodium to 2500mg  daily. ?

## 2022-01-27 NOTE — Patient Instructions (Addendum)
Medication Instruction: ?CHANGE YOUR HYDROCHLOROTHIAZIDE TO ONCE DAILY IN THE MORNING  ? ?START AMLODIPINE 5 MG DAILY IN THE EVENING  ?  ?Labwork: ?NONE ?  ?Testing/Procedures: ?NONE ?  ?Follow-Up: ?03/01/2022 9:00 AM WITH PHARM D AT [facility]  ?  ?Special Instructions:  ? ?MONITOR YOUR BLOOD PRESSURE TWICE A DAY WITH MACHINE PROVIDED. LOG IN BOOK AND BRING BOOK AND MACHINE TO FOLLOW UP  ? ?DASH Eating Plan ?DASH stands for "Dietary Approaches to Stop Hypertension." The DASH eating plan is a healthy eating plan that has been shown to reduce high blood pressure (hypertension). It may also reduce your risk for type 2 diabetes, heart disease, and stroke. The DASH eating plan may also help with weight loss. ?What are tips for following this plan? ? ?General guidelines ?Avoid eating more than 2,300 mg (milligrams) of salt (sodium) a day. If you have hypertension, you may need to reduce your sodium intake to 1,500 mg a day. ?Limit alcohol intake to no more than 1 drink a day for nonpregnant women and 2 drinks a day for men. One drink equals 12 oz of beer, 5 oz of wine, or 1? oz of hard liquor. ?Work with your health care provider to maintain a healthy body weight or to lose weight. Ask what an ideal weight is for you. ?Get at least 30 minutes of exercise that causes your heart to beat faster (aerobic exercise) most days of the week. Activities may include walking, swimming, or biking. ?Work with your health care provider or diet and nutrition specialist (dietitian) to adjust your eating plan to your individual calorie needs. ?Reading food labels ? ?Check food labels for the amount of sodium per serving. Choose foods with less than 5 percent of the Daily Value of sodium. Generally, foods with less than 300 mg of sodium per serving fit into this eating plan. ?To find whole grains, look for the word "whole" as the first word in the ingredient list. ?Shopping ?Buy products labeled as "low-sodium" or "no salt  added." ?Buy fresh foods. Avoid canned foods and premade or frozen meals. ?Cooking ?Avoid adding salt when cooking. Use salt-free seasonings or herbs instead of table salt or sea salt. Check with your health care provider or pharmacist before using salt substitutes. ?Do not fry foods. Cook foods using healthy methods such as baking, boiling, grilling, and broiling instead. ?Cook with heart-healthy oils, such as olive, canola, soybean, or sunflower oil. ?Meal planning ?Eat a balanced diet that includes: ?5 or more servings of fruits and vegetables each day. At each meal, try to fill half of your plate with fruits and vegetables. ?Up to 6-8 servings of whole grains each day. ?Less than 6 oz of lean meat, poultry, or fish each day. A 3-oz serving of meat is about the same size as a deck of cards. One egg equals 1 oz. ?2 servings of low-fat dairy each day. ?A serving of nuts, seeds, or beans 5 times each week. ?Heart-healthy fats. Healthy fats called Omega-3 fatty acids are found in foods such as flaxseeds and coldwater fish, like sardines, salmon, and mackerel. ?Limit how much you eat of the following: ?Canned or prepackaged foods. ?Food that is high in trans fat, such as fried foods. ?Food that is high in saturated fat, such as fatty meat. ?Sweets, desserts, sugary drinks, and other foods with added sugar. ?Full-fat dairy products. ?Do not salt foods before eating. ?Try to eat at least 2 vegetarian meals each week. ?Eat more home-cooked food  and less restaurant, buffet, and fast food. ?When eating at a restaurant, ask that your food be prepared with less salt or no salt, if possible. ?What foods are recommended? ?The items listed may not be a complete list. Talk with your dietitian about what dietary choices are best for you. ?Grains ?Whole-grain or whole-wheat bread. Whole-grain or whole-wheat pasta. Brown rice. Orpah Cobb. Bulgur. Whole-grain and low-sodium cereals. Pita bread. Low-fat, low-sodium crackers.  Whole-wheat flour tortillas. ?Vegetables ?Fresh or frozen vegetables (raw, steamed, roasted, or grilled). Low-sodium or reduced-sodium tomato and vegetable juice. Low-sodium or reduced-sodium tomato sauce and tomato paste. Low-sodium or reduced-sodium canned vegetables. ?Fruits ?All fresh, dried, or frozen fruit. Canned fruit in natural juice (without added sugar). ?Meat and other protein foods ?Skinless chicken or [place]. Ground chicken or [place]. Pork with fat trimmed off. Fish and seafood. Egg whites. Dried beans, peas, or lentils. Unsalted nuts, nut butters, and seeds. Unsalted canned beans. Lean cuts of beef with fat trimmed off. Low-sodium, lean deli meat. ?Dairy ?Low-fat (1%) or fat-free (skim) milk. Fat-free, low-fat, or reduced-fat cheeses. Nonfat, low-sodium ricotta or cottage cheese. Low-fat or nonfat yogurt. Low-fat, low-sodium cheese. ?Fats and oils ?Soft margarine without trans fats. Vegetable oil. Low-fat, reduced-fat, or light mayonnaise and salad dressings (reduced-sodium). Canola, safflower, olive, soybean, and sunflower oils. Avocado. ?Seasoning and other foods ?Herbs. Spices. Seasoning mixes without salt. Unsalted popcorn and pretzels. Fat-free sweets. ?What foods are not recommended? ?The items listed may not be a complete list. Talk with your dietitian about what dietary choices are best for you. ?Grains ?Baked goods made with fat, such as croissants, muffins, or some breads. Dry pasta or rice meal packs. ?Vegetables ?Creamed or fried vegetables. Vegetables in a cheese sauce. Regular canned vegetables (not low-sodium or reduced-sodium). Regular canned tomato sauce and paste (not low-sodium or reduced-sodium). Regular tomato and vegetable juice (not low-sodium or reduced-sodium). Rosita Fire. Olives. ?Fruits ?Canned fruit in a light or heavy syrup. Fried fruit. Fruit in cream or butter sauce. ?Meat and other protein foods ?Fatty cuts of meat. Ribs. Fried meat. Tomasa Blase. Sausage. Bologna and other  processed lunch meats. Salami. Fatback. Hotdogs. Bratwurst. Salted nuts and seeds. Canned beans with added salt. Canned or smoked fish. Whole eggs or egg yolks. Chicken or [place] with skin. ?Dairy ?Whole or 2% milk, cream, and half-and-half. Whole or full-fat cream cheese. Whole-fat or sweetened yogurt. Full-fat cheese. Nondairy creamers. Whipped toppings. Processed cheese and cheese spreads. ?Fats and oils ?Butter. Stick margarine. Lard. Shortening. Ghee. Bacon fat. Tropical oils, such as coconut, palm kernel, or palm oil. ?Seasoning and other foods ?Salted popcorn and pretzels. Onion salt, garlic salt, seasoned salt, table salt, and sea salt. Worcestershire sauce. Tartar sauce. Barbecue sauce. Teriyaki sauce. Soy sauce, including reduced-sodium. Steak sauce. Canned and packaged gravies. Fish sauce. Oyster sauce. Cocktail sauce. Horseradish that you find on the shelf. Ketchup. Mustard. Meat flavorings and tenderizers. Bouillon cubes. Hot sauce and Tabasco sauce. Premade or packaged marinades. Premade or packaged taco seasonings. Relishes. Regular salad dressings. ?Where to find more information: ?National Heart, Lung, and Blood Institute: PopSteam.is ?American Heart Association: www.heart.org ?Summary ?The DASH eating plan is a healthy eating plan that has been shown to reduce high blood pressure (hypertension). It may also reduce your risk for type 2 diabetes, heart disease, and stroke. ?With the DASH eating plan, you should limit salt (sodium) intake to 2,300 mg a day. If you have hypertension, you may need to reduce your sodium intake to 1,500 mg a day. ?When on the DASH  eating plan, aim to eat more fresh fruits and vegetables, whole grains, lean proteins, low-fat dairy, and heart-healthy fats. ?Work with your health care provider or diet and nutrition specialist (dietitian) to adjust your eating plan to your individual calorie needs. ?This information is not intended to replace advice given to you by your  health care provider. Make sure you discuss any questions you have with your health care provider. ?Document Released: 08/24/2011 Document Revised: 08/17/2017 Document Reviewed: 08/28/2016 ?Elsevier Patient [employer]

## 2022-01-27 NOTE — Assessment & Plan Note (Signed)
She struggles to exercise as regularly after she started commuting to work.  She did better when she was working from home.  She is going to work on trying to get back to 150 minutes weekly. ?

## 2022-02-03 ENCOUNTER — Ambulatory Visit (HOSPITAL_BASED_OUTPATIENT_CLINIC_OR_DEPARTMENT_OTHER): Payer: BC Managed Care – PPO | Admitting: Cardiovascular Disease

## 2022-03-01 ENCOUNTER — Encounter: Payer: Self-pay | Admitting: Pharmacist

## 2022-03-01 ENCOUNTER — Ambulatory Visit: Payer: BC Managed Care – PPO | Admitting: Pharmacist

## 2022-03-01 VITALS — BP 136/99 | HR 91 | Wt 249.8 lb

## 2022-03-01 DIAGNOSIS — I10 Essential (primary) hypertension: Secondary | ICD-10-CM | POA: Diagnosis not present

## 2022-03-01 DIAGNOSIS — L500 Allergic urticaria: Secondary | ICD-10-CM | POA: Insufficient documentation

## 2022-03-01 DIAGNOSIS — J3081 Allergic rhinitis due to animal (cat) (dog) hair and dander: Secondary | ICD-10-CM | POA: Insufficient documentation

## 2022-03-01 DIAGNOSIS — H1045 Other chronic allergic conjunctivitis: Secondary | ICD-10-CM | POA: Insufficient documentation

## 2022-03-01 DIAGNOSIS — J301 Allergic rhinitis due to pollen: Secondary | ICD-10-CM | POA: Insufficient documentation

## 2022-03-01 NOTE — Patient Instructions (Signed)
It was nice meeting you today!  We would like your blood pressure to stay less than 130/80  Since you have slight swelling, we can reduce your amlodipine to 2.5 mg once nightly (1/2 tablet)  We will increase your hydrochlorothiazide to 25mg  (2 tablets in the morning)  Continue to check your blood pressure at home and continue your diet changes  Please call with any questions  , PharmD, BCACP, CDCES, CPP [address], [address] [place], [place], [place] Phone: [phone], Fax: [phone]

## 2022-03-01 NOTE — Progress Notes (Signed)
Patient ID: NAKEMA FAKE                 DOB: [birth year]                      MRN: [id number]     HPI: Alexis Clarke is a 46 y.o. female referred by Dr. Duke Salvia to HTN clinic. PMH is significant for HTN and obesity. Patient also may have white coat hypertension.  At first visit with Dr Duke Salvia, patient was started on amlodipine 5mg  and advised to begin checking BP at home. Patient enrolled in group 1.  Remained on HCTZ 12.5mg  daily  Presents today in good spirits. Works as a in [employer] and has noticed her feet have started swelling especially at the end of the day.  However she feels much better since starting the amlodipine and BP has reduced.  Home readings:  6/14: 122/89 6/13: 113/79, 118/86 6/12: 123/81, 114/79 6/11: 133/94, 118/84 6/10: 116/81, 117/86  Has been working on diet changes as well. Has reduced salt intake and is reducing portion sizes. Has been ordering kids meals if she eats out and has begun meal prepping. Has lot 5 pounds since last month.  Current HTN meds:  HCTZ 12.5mg  daily Amlodipine 5mg  daily   Previously tried: N/A  BP goal: <130/80   Wt Readings from Last 3 Encounters:  01/27/22 254 lb 9.6 oz (115.5 kg)   BP Readings from Last 3 Encounters:  01/27/22 (!) 157/119   Pulse Readings from Last 3 Encounters:  01/27/22 (!) 108    Renal function: CrCl cannot be calculated (No successful lab value found.).  Past Medical History:  Diagnosis Date   Essential hypertension 01/27/2022   Hypertension    Morbid obesity ([facility]) 01/27/2022   Sinus tachycardia 01/27/2022    Current Outpatient Medications on File Prior to Visit  Medication Sig Dispense Refill   fluticasone (FLONASE SENSIMIST) 27.5 MCG/SPRAY nasal spray 2 sprays each nostril     amLODipine (NORVASC) 5 MG tablet Take 1 tablet (5 mg total) by mouth at bedtime. 90 tablet 3   cetirizine (ZYRTEC) 10 MG tablet Take 1 tablet by mouth as needed.     EPINEPHrine (EPIPEN 2-PAK)  0.3 mg/0.3 mL IJ SOAJ injection See admin instructions.     hydrochlorothiazide (MICROZIDE) 12.5 MG capsule Take 1 capsule (12.5 mg total) by mouth daily. 90 capsule 3   Olopatadine HCl 0.6 % SOLN Place 2 sprays into both nostrils 2 (two) times daily.     No current facility-administered medications on file prior to visit.    Allergies  Allergen Reactions   Almond (Diagnostic) Rash   Other Rash   Shellfish-Derived Products Rash     Assessment/Plan:  1. Hypertension -  Patient BP in room 136/99 which is above goal of <130/80 however likely falsely elevated due to white coat HTN.  Home readings much closer to goal.  Patient feels better on amlodipine but unfortunately has swelling in feet. Will reduce to 2.5mg  to see if it resolves. If not, may have to d/c.  Advised that since BP may increase, will increase HCTZ to 25mg  at this time. Patient is agreeable to plan.  Recheck in 4 weeks.  Reduce amlodipine to 2.5mg  once nightly Increase HCTZ to 25mg  in the morning Continue checking BP at home Recheck in clinic in 4 weeks  03/29/2022, PharmD, BCACP, CDCES, CPP [address] [place], , [place] Phone: [phone], Fax: [phone]

## 2022-03-30 ENCOUNTER — Encounter: Payer: Self-pay | Admitting: Pharmacist

## 2022-03-30 ENCOUNTER — Ambulatory Visit: Payer: BC Managed Care – PPO | Admitting: Pharmacist

## 2022-03-30 VITALS — BP 142/105 | HR 106 | Ht 62.0 in | Wt 249.8 lb

## 2022-03-30 DIAGNOSIS — I10 Essential (primary) hypertension: Secondary | ICD-10-CM

## 2022-03-30 MED ORDER — HYDROCHLOROTHIAZIDE 25 MG PO TABS: 25.0000 mg | ORAL_TABLET | Freq: Every day | ORAL | 1 refills | Status: DC

## 2022-03-30 MED ORDER — VALSARTAN 80 MG PO TABS: 80.0000 mg | ORAL_TABLET | Freq: Every day | ORAL | 2 refills | Status: DC

## 2022-03-30 NOTE — Patient Instructions (Addendum)
It was good seeing you again  We would like your blood pressure to be less than 130/80  Your home readings look good, just slightly above goal  Continue your amlodipine 2.5mg  and HCTZ 25mg   We will start a new medication called valsartan 80mg  once a day  Please update your lab work in about a week  We will see you back in 1 month  , PharmD, BCACP, CDCES, CPP [address] [place], [address], [place] Phone: [phone] Fax: [phone]

## 2022-03-30 NOTE — Progress Notes (Signed)
Patient ID: YMANI PORCHER                 DOB: 1975-10-15                      MRN: [id number]     HPI: Alexis Clarke is a 46 y.o. female referred by Dr. Duke Salvia to HTN clinic. PMH is significant for HTN and obesity. Patient also may have white coat hypertension.  At first visit with Dr Duke Salvia, patient was started on amlodipine 5mg  and advised to begin checking BP at home. Patient enrolled in group 1.  Remained on HCTZ 12.5mg  daily  At first PharmD visit patient reported swollen feet. Amlodipine dose was reduced and HCTZ was increased.  Patient presents today in good spirits but senses her anxiety increasing about having blood pressure checked. Swelling in lower extremities has gone away but diastolic BP has remained elevated.  Home readings:  7/14: 120/89 7/13: 115/85, 113/73 7/12: 124/90, 126/89 7/11: 121/85, 117/79 7/10: 115/77, 127/83  Needs updated labs.  Current HTN meds:  HCTZ 25mg  daily Amlodipine 2.5mg  daily   Previously tried: N/A  BP goal: <130/80   Wt Readings from Last 3 Encounters:  03/30/22 249 lb 12.8 oz (113.3 kg)  03/01/22 249 lb 12.8 oz (113.3 kg)  01/27/22 254 lb 9.6 oz (115.5 kg)   BP Readings from Last 3 Encounters:  03/30/22 (!) 142/105  03/01/22 (!) 136/99  01/27/22 (!) 157/119   Pulse Readings from Last 3 Encounters:  03/30/22 (!) 106  03/01/22 91  01/27/22 (!) 108    Renal function: CrCl cannot be calculated (No successful lab value found.).  Past Medical History:  Diagnosis Date   Essential hypertension 01/27/2022   Hypertension    Morbid obesity (HCC) 01/27/2022   Sinus tachycardia 01/27/2022    Current Outpatient Medications on File Prior to Visit  Medication Sig Dispense Refill   amLODipine (NORVASC) 5 MG tablet Take 0.5 tablets (2.5 mg total) by mouth at bedtime. 90 tablet 3   cetirizine (ZYRTEC) 10 MG tablet Take 1 tablet by mouth as needed.     EPINEPHrine (EPIPEN 2-PAK) 0.3 mg/0.3 mL IJ SOAJ injection See admin  instructions.     fluticasone (FLONASE SENSIMIST) 27.5 MCG/SPRAY nasal spray 2 sprays each nostril     Olopatadine HCl 0.6 % SOLN Place 2 sprays into both nostrils 2 (two) times daily.     No current facility-administered medications on file prior to visit.    Allergies  Allergen Reactions   Almond (Diagnostic) Rash   Other Rash   Shellfish-Derived Products Rash     Assessment/Plan:  1. Hypertension -  Patient BP in room 147/98 which is above goal of <130/80 however likely falsely elevated due to white coat HTN.  Home readings much closer to goal.  Repeat BP also elevated at 142/105.  Unfortunately had swelling on amlodipine 5mg  daily. Will start ARB at this time. Patient agreeable. Will continue to check BP at home and check BMP in 1 week.  HYPERTENSION CONTROL Vitals:   03/30/22 1340 03/30/22 1347  BP: (!) 147/98 (!) 142/105    The patient's blood pressure is elevated above target today.  The following intervention was performed to address the patient's elevated BP:  - A new medication was prescribed today.    Continue amlodipine 2.5mg  daily Continue HCTZ 25mg  daily Start valsartan 80mg  daily Check BMP in 1 week Recheck in clinic in 4 weeks  , PharmD, [place],  CDCES, CPP [address], [address] [place], [place], [address] Phone: [phone], Fax: [phone]

## 2022-04-18 DIAGNOSIS — Z1211 Encounter for screening for malignant neoplasm of colon: Secondary | ICD-10-CM | POA: Diagnosis not present

## 2022-04-22 ENCOUNTER — Other Ambulatory Visit: Payer: Self-pay | Admitting: Cardiovascular Disease

## 2022-04-22 DIAGNOSIS — I10 Essential (primary) hypertension: Secondary | ICD-10-CM

## 2022-04-24 NOTE — Telephone Encounter (Signed)
Rx(s) sent to pharmacy electronically.  

## 2022-04-28 ENCOUNTER — Other Ambulatory Visit: Payer: Self-pay

## 2022-04-28 ENCOUNTER — Ambulatory Visit (INDEPENDENT_AMBULATORY_CARE_PROVIDER_SITE_OTHER): Payer: BC Managed Care – PPO | Admitting: Pharmacist

## 2022-04-28 VITALS — BP 126/75

## 2022-04-28 DIAGNOSIS — I10 Essential (primary) hypertension: Secondary | ICD-10-CM

## 2022-04-28 LAB — BASIC METABOLIC PANEL
BUN/Creatinine Ratio: 11 (ref 9–23)
BUN: 11 mg/dL (ref 6–24)
CO2: 27 mmol/L (ref 20–29)
Calcium: 9.2 mg/dL (ref 8.7–10.2)
Chloride: 100 mmol/L (ref 96–106)
Creatinine, Ser: 1 mg/dL (ref 0.57–1.00)
Glucose: 86 mg/dL (ref 70–99)
Potassium: 4 mmol/L (ref 3.5–5.2)
Sodium: 139 mmol/L (ref 134–144)
eGFR: 70 mL/min/{1.73_m2} (ref >59)

## 2022-04-28 NOTE — Progress Notes (Unsigned)
Patient ID: Alexis Clarke                 DOB: Feb 08, 1976                      MRN: [id number]     HPI: Alexis Clarke is a 46 y.o. female referred by Dr. Duke Salvia to HTN clinic. PMH is significant for  113/73 119/82 130/82 124/82  Current HTN meds:  Previously tried:  BP goal:   Family History:   Social History:   Diet:   Exercise:   Home BP readings:   Wt Readings from Last 3 Encounters:  03/30/22 249 lb 12.8 oz (113.3 kg)  03/01/22 249 lb 12.8 oz (113.3 kg)  01/27/22 254 lb 9.6 oz (115.5 kg)   BP Readings from Last 3 Encounters:  03/30/22 (!) 142/105  03/01/22 (!) 136/99  01/27/22 (!) 157/119   Pulse Readings from Last 3 Encounters:  03/30/22 (!) 106  03/01/22 91  01/27/22 (!) 108    Renal function: CrCl cannot be calculated (No successful lab value found.).  Past Medical History:  Diagnosis Date   Essential hypertension 01/27/2022   Hypertension    Morbid obesity (HCC) 01/27/2022   Sinus tachycardia 01/27/2022    Current Outpatient Medications on File Prior to Visit  Medication Sig Dispense Refill   amLODipine (NORVASC) 5 MG tablet Take 0.5 tablets (2.5 mg total) by mouth at bedtime. 90 tablet 3   cetirizine (ZYRTEC) 10 MG tablet Take 1 tablet by mouth as needed.     EPINEPHrine (EPIPEN 2-PAK) 0.3 mg/0.3 mL IJ SOAJ injection See admin instructions.     fluticasone (FLONASE SENSIMIST) 27.5 MCG/SPRAY nasal spray 2 sprays each nostril     hydrochlorothiazide (HYDRODIURIL) 25 MG tablet Take 1 tablet (25 mg total) by mouth daily. 90 tablet 1   Olopatadine HCl 0.6 % SOLN Place 2 sprays into both nostrils 2 (two) times daily.     valsartan (DIOVAN) 80 MG tablet TAKE 1 TABLET BY MOUTH EVERY DAY 90 tablet 1   No current facility-administered medications on file prior to visit.    Allergies  Allergen Reactions   Almond (Diagnostic) Rash   Other Rash   Shellfish-Derived Products Rash     Assessment/Plan:  1. Hypertension -

## 2022-04-28 NOTE — Patient Instructions (Signed)
It was great seeing you again  We would like to keep your blood pressure less than 130/80  We will update your lab work today  Please continue your amlodipine 2.5mg  at night, valsartan 80mg  in the morning, and hydrochlorothiazide 25mg  in the morning  We will contact your with your lab results  Your next visit will be with Dr at our [place] office  Please call or message with any questions  , PharmD, BCACP, CDCES, CPP [address] [place], [address], [place] Phone: [phone], Fax: [phone]

## 2022-05-01 ENCOUNTER — Encounter: Payer: Self-pay | Admitting: Pharmacist

## 2022-05-03 ENCOUNTER — Telehealth (HOSPITAL_BASED_OUTPATIENT_CLINIC_OR_DEPARTMENT_OTHER): Payer: Self-pay

## 2022-05-03 NOTE — Telephone Encounter (Addendum)
Called results to patient and left results on VM (ok per DPR), instructions left to call office back if patient has any questions!     ----- Message from Alver Sorrow, NP sent at 05/02/2022  1:10 PM EDT ----- Stable kidney function. Normal electrolytes. Good result! Continue current meds. F/u as scheduled.

## 2022-06-02 NOTE — Progress Notes (Signed)
Advanced Hypertension Clinic Follow-up:    Date:  06/05/2022   ID:  Alexis Clarke, DOB [birth year], MRN [id number]  PCP:  Christa See, FNP  Cardiologist:  None  Nephrologist:  Referring MD: Christa See, FNP   CC: Hypertension  History of Present Illness:    Alexis Clarke is a 46 y.o. female with a hx of hypertension, and morbid obesity, here for follow up. She was initially seen 01/27/2022 in the [facility] Hypertension Clinic.  She saw Dr. Garwin Brothers on 07/2021.  At that visit her blood pressure was 160/90 on hydrochlorothiazide.    Her blood pressures are labile and higher in the office than at home. Amlodipine was added and she was encouraged to work on lifestyle changes. She enrolled in our remote patient monitoring study. At follow up visits with our pharmacist her home blood pressures were well controlled but office blood pressures were elevated consistent with white coat hypertension. Amlodipine was reduced and HCTZ was increased due to swelling. Valsartan was added due to elevated diastolic blood pressures.   Today, she states she was surprised to see her blood pressure in clinic was 134/92. At home it is closer to 118/76 on average. A couple times she has had lower blood pressures, at which times she didn't take her amlodipine at night. She has some lightheadedness with her low blood pressures, but this has not been frequent. Since her amlodipine was discontinued, her swelling has resolved. Lately she has struggled with adhering to her diet and exercise routines due to stressors such as waiting to close on her house. When she is able to exercise she feels well. She has never smoked. She denies any palpitations, chest pain, shortness of breath, or peripheral edema. No headaches, syncope, orthopnea, or PND.  Previous antihypertensives: HCTZ  Past Medical History:  Diagnosis Date   Essential hypertension 01/27/2022   Hypertension    Morbid obesity ([place]) 01/27/2022   Pure  hypercholesterolemia 06/05/2022   Sinus tachycardia 01/27/2022    Past Surgical History:  Procedure Laterality Date   TMJ ARTHROPLASTY      Current Medications: Current Meds  Medication Sig   amLODipine (NORVASC) 5 MG tablet Take 0.5 tablets (2.5 mg total) by mouth at bedtime.   cetirizine (ZYRTEC) 10 MG tablet Take 1 tablet by mouth as needed.   EPINEPHrine (EPIPEN 2-PAK) 0.3 mg/0.3 mL IJ SOAJ injection See admin instructions.   fluticasone (FLONASE SENSIMIST) 27.5 MCG/SPRAY nasal spray 2 sprays each nostril   hydrochlorothiazide (HYDRODIURIL) 25 MG tablet Take 1 tablet (25 mg total) by mouth daily.   Olopatadine HCl 0.6 % SOLN Place 2 sprays into both nostrils 2 (two) times daily.   valsartan (DIOVAN) 80 MG tablet TAKE 1 TABLET BY MOUTH EVERY DAY     Allergies:   Almond (diagnostic), Other, and Shellfish-derived products   Social History   Socioeconomic History   Marital status: Single    Spouse name: Not on file   Number of children: Not on file   Years of education: Not on file   Highest education level: Not on file  Occupational History   Not on file  Tobacco Use   Smoking status: Never   Smokeless tobacco: Never  Substance and Sexual Activity   Alcohol use: Never   Drug use: Never   Sexual activity: Not on file  Other Topics Concern   Not on file  Social History Narrative   Not on file   Social Determinants of Health   Financial Resource  Strain: Low Risk  (01/27/2022)   Overall Financial Resource Strain (CARDIA)    Difficulty of Paying Living Expenses: Not hard at all  Food Insecurity: No Food Insecurity (01/27/2022)   Hunger Vital Sign    Worried About Running Out of Food in the Last Year: Never true    Ran Out of Food in the Last Year: Never true  Transportation Needs: No Transportation Needs (01/27/2022)   PRAPARE - Administrator, Civil Service (Medical): No    Lack of Transportation (Non-Medical): No  Physical Activity: Insufficiently Active  (01/27/2022)   Exercise Vital Sign    Days of Exercise per Week: 3 days    Minutes of Exercise per Session: 30 min  Stress: Not on file  Social Connections: Not on file     Family History: The patient's family history includes Hyperlipidemia in her mother; Hypertension in her father and mother.  ROS:   Please see the history of present illness.    (+) Lightheadedness associated with low BP All other systems reviewed and are negative.  EKGs/Labs/Other Studies Reviewed:    EKG:  EKG is personally reviewed. 06/05/2022:  EKG was not ordered. 01/27/2022: sinus tachycardia.  Rate 108 bpm.  Nonspecific T wave abnormalities.  Recent Labs: 04/28/2022: BUN 11; Creatinine, Ser 1.00; Potassium 4.0; Sodium 139   Recent Lipid Panel No results found for: "CHOL", "TRIG", "HDL", "CHOLHDL", "VLDL", "LDLCALC", "LDLDIRECT"  Physical Exam:    VS:  BP 132/72 (BP Location: Right Arm, Patient Position: Sitting, Cuff Size: Large)   Pulse (!) 102   Ht 5\' 2"  (1.575 m)   Wt 251 lb 14.4 oz (114.3 kg)   SpO2 100%   BMI 46.07 kg/m  , BMI Body mass index is 46.07 kg/m. GENERAL:  Well appearing HEENT: Pupils equal round and reactive, fundi not visualized, oral mucosa unremarkable NECK:  No jugular venous distention, waveform within normal limits, carotid upstroke brisk and symmetric, no bruits, no thyromegaly LUNGS:  Clear to auscultation bilaterally HEART:  RRR.  PMI not displaced or sustained,S1 and S2 within normal limits, no S3, no S4, no clicks, no rubs, no murmurs ABD:  Flat, positive bowel sounds normal in frequency in pitch, no bruits, no rebound, no guarding, no midline pulsatile mass, no hepatomegaly, no splenomegaly EXT:  2 plus pulses throughout, no edema, no cyanosis no clubbing SKIN:  No rashes no nodules NEURO:  Cranial nerves II through XII grossly intact, motor grossly intact throughout PSYCH:  Cognitively intact, oriented to person place and time   ASSESSMENT/PLAN:    Essential  hypertension Blood pressure is well-controlled.  It is better at home than in the office.  Continue amlodipine, HCTZ, and valsartan.  She is going to work on getting back into her exercise when she gets settled after her move.  Blood pressure goal is less than 130/80.  Morbid obesity (HCC) Would consider GLP-1 agonist if available with her insurance.  Keep working on diet and exercise.  Pure hypercholesterolemia Lipids are elevated but her ASCVD 10-year risk is only 5.1%.  She is interested in getting a coronary calcium score to better understand her risk.    Screening for Secondary Hypertension:     01/27/2022    9:18 AM  Causes  Drugs/Herbals Screened     - Comments limiting sodium.  no caffeine. No EtOH.  No NSAIDS.  Sleep Apnea Screened     - Comments snores but no other symtpoms.  Thyroid Disease Screened     -  Comments normal with PCP 01/2022  Hyperaldosteronism N/A  Pheochromocytoma N/A  Cushing's Syndrome N/A  Hyperparathyroidism N/A  Coarctation of the Aorta Screened     - Comments BP symmetric  Compliance Screened    Relevant Labs/Studies:    Latest Ref Rng & Units 04/28/2022    9:38 AM  Basic Labs  Sodium 134 - 144 mmol/L 139   Potassium 3.5 - 5.2 mmol/L 4.0   Creatinine 0.57 - 1.00 mg/dL 3.08                    Disposition:    FU with MD/PharmD in 6 months.    Medication Adjustments/Labs and Tests Ordered: Current medicines are reviewed at length with the patient today.  Concerns regarding medicines are outlined above.   No orders of the defined types were placed in this encounter.  No orders of the defined types were placed in this encounter.  I,Mathew Stumpf,acting as a Neurosurgeon for Chilton Si, MD.,have documented all relevant documentation on the behalf of Chilton Si, MD,as directed by  Chilton Si, MD while in the presence of Chilton Si, MD.  I, Leeroy Lovings C. Duke Salvia, MD have reviewed all documentation for this visit.  The  documentation of the exam, diagnosis, procedures, and orders on 06/05/2022 are all accurate and complete.   Signed, Chilton Si, MD  06/05/2022 8:40 AM    [facility]

## 2022-06-05 ENCOUNTER — Ambulatory Visit (INDEPENDENT_AMBULATORY_CARE_PROVIDER_SITE_OTHER): Payer: BC Managed Care – PPO | Admitting: Cardiovascular Disease

## 2022-06-05 ENCOUNTER — Encounter (HOSPITAL_BASED_OUTPATIENT_CLINIC_OR_DEPARTMENT_OTHER): Payer: Self-pay | Admitting: Cardiovascular Disease

## 2022-06-05 VITALS — BP 132/72 | HR 102 | Ht 62.0 in | Wt 251.9 lb

## 2022-06-05 DIAGNOSIS — I10 Essential (primary) hypertension: Secondary | ICD-10-CM

## 2022-06-05 DIAGNOSIS — E7800 Pure hypercholesterolemia, unspecified: Secondary | ICD-10-CM | POA: Diagnosis not present

## 2022-06-05 DIAGNOSIS — R000 Tachycardia, unspecified: Secondary | ICD-10-CM

## 2022-06-05 HISTORY — DX: Pure hypercholesterolemia, unspecified: E78.00

## 2022-06-05 NOTE — Patient Instructions (Signed)
Medication Instructions:  Your physician recommends that you continue on your current medications as directed. Please refer to the Current Medication list given to you today.   Labwork: NONE  Testing/Procedures: CALCIUM SCORE, THIS WILL COST YOU $99 OUT OF POCKET   Follow-Up: 11/23/2022 8:00 AM WITH DR [facility]   If you need a refill on your cardiac medications before your next appointment, please call your pharmacy.

## 2022-06-05 NOTE — Assessment & Plan Note (Signed)
Blood pressure is well-controlled.  It is better at home than in the office.  Continue amlodipine, HCTZ, and valsartan.  She is going to work on getting back into her exercise when she gets settled after her move.  Blood pressure goal is less than 130/80.

## 2022-06-05 NOTE — Assessment & Plan Note (Signed)
Lipids are elevated but her ASCVD 10-year risk is only 5.1%.  She is interested in getting a coronary calcium score to better understand her risk.

## 2022-06-05 NOTE — Assessment & Plan Note (Addendum)
Would consider GLP-1 agonist if available with her insurance.  Keep working on diet and exercise.

## 2022-06-19 DIAGNOSIS — Z1231 Encounter for screening mammogram for malignant neoplasm of breast: Secondary | ICD-10-CM | POA: Diagnosis not present

## 2022-06-20 DIAGNOSIS — Z01419 Encounter for gynecological examination (general) (routine) without abnormal findings: Secondary | ICD-10-CM | POA: Diagnosis not present

## 2022-06-20 DIAGNOSIS — I10 Essential (primary) hypertension: Secondary | ICD-10-CM | POA: Diagnosis not present

## 2022-06-20 DIAGNOSIS — Z30431 Encounter for routine checking of intrauterine contraceptive device: Secondary | ICD-10-CM | POA: Diagnosis not present

## 2022-07-06 ENCOUNTER — Ambulatory Visit (HOSPITAL_BASED_OUTPATIENT_CLINIC_OR_DEPARTMENT_OTHER): Payer: BC Managed Care – PPO

## 2022-07-13 DIAGNOSIS — Z23 Encounter for immunization: Secondary | ICD-10-CM | POA: Diagnosis not present

## 2022-08-05 IMAGING — CR DG CHEST 1V
1 series · 1 of 1 positions shown · non-contrast
Comparison: None.

CLINICAL DATA: 44-year-old female with positive TB screening test.

EXAM:
CHEST  1 VIEW

[w chest pa]
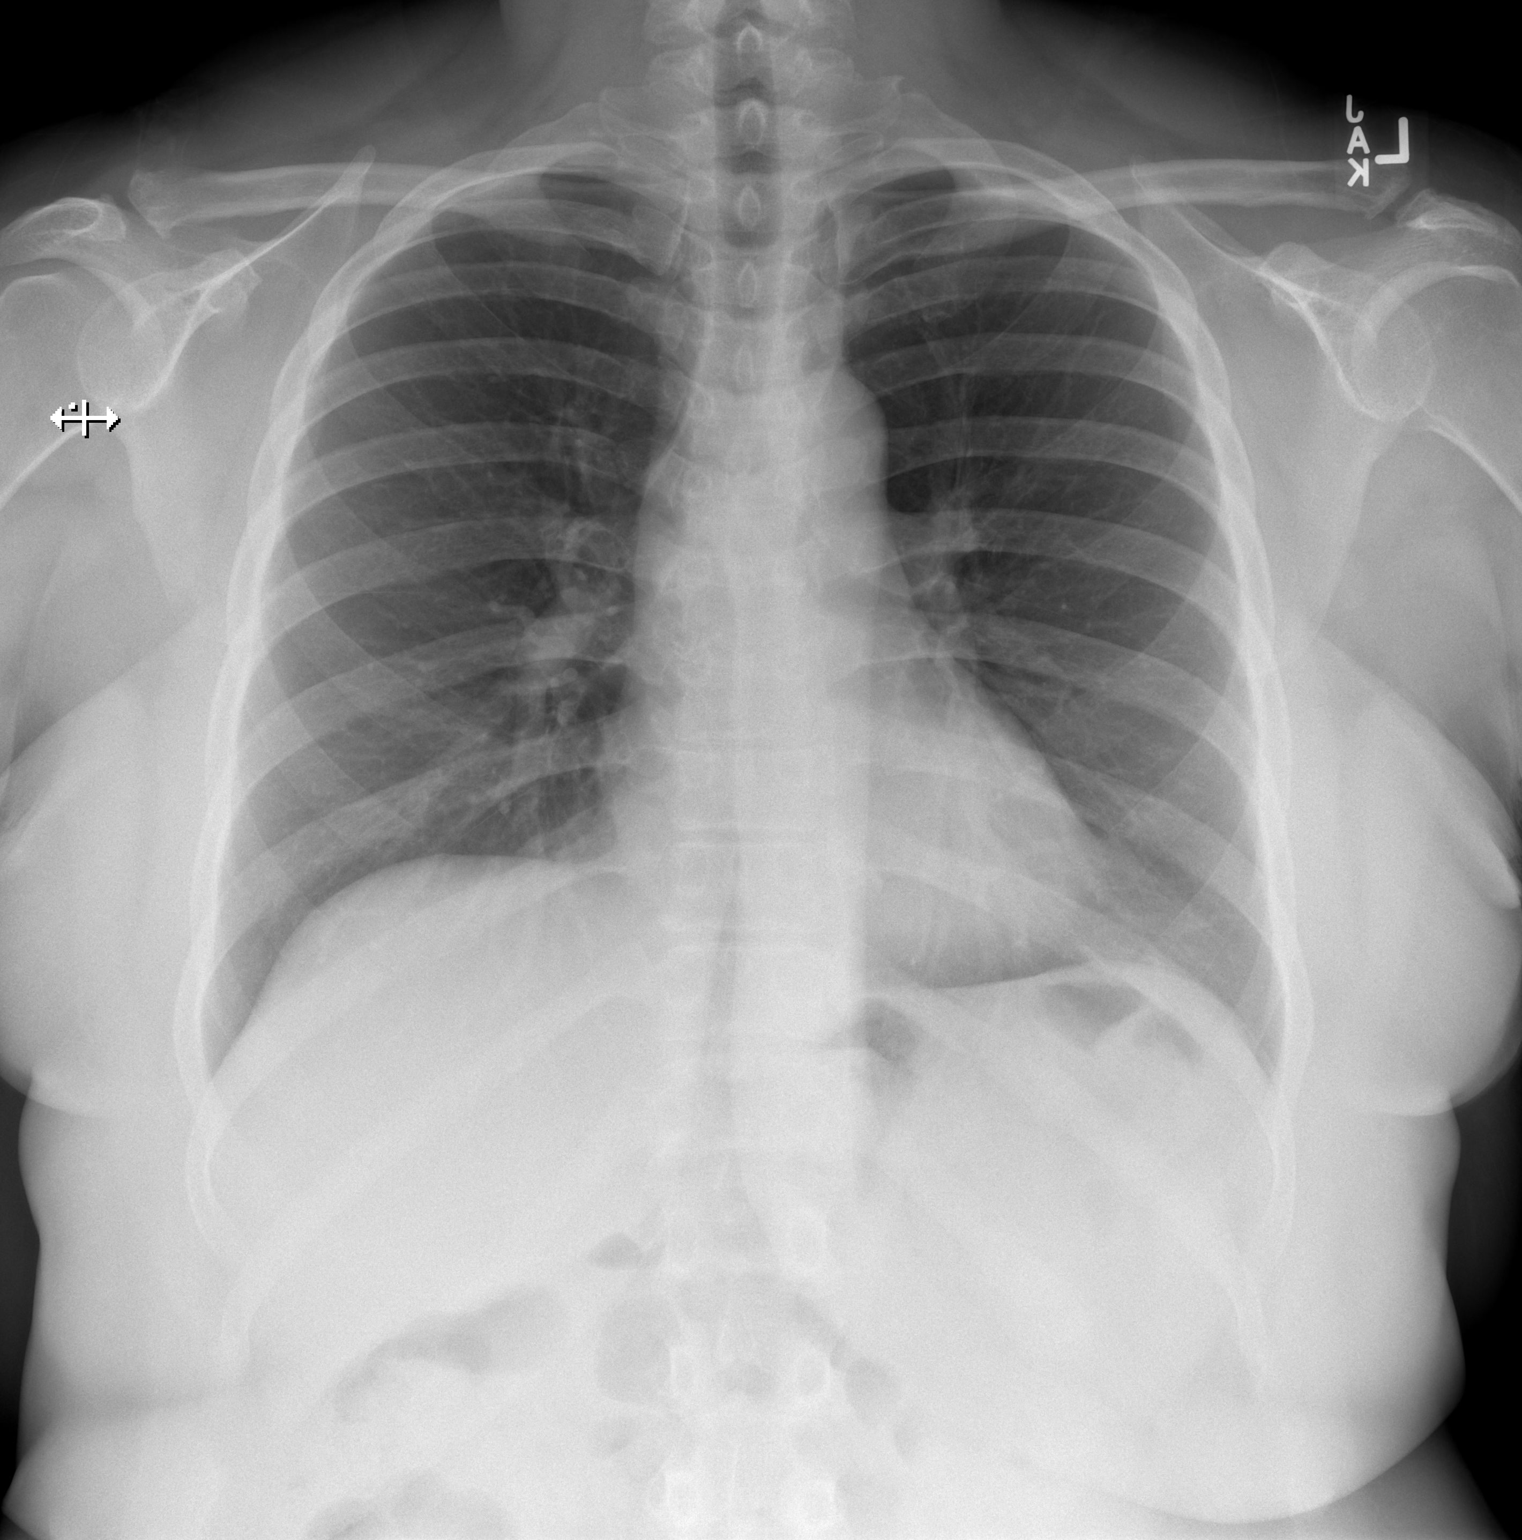

[1 of 1 positions shown; findings below may reference images not displayed]

FINDINGS: The lungs are clear and negative for focal airspace consolidation,
pulmonary edema or suspicious pulmonary nodule. No pleural effusion
or pneumothorax. Cardiac and mediastinal contours are within normal
limits. No acute fracture or lytic or blastic osseous lesions. The
visualized upper abdominal bowel gas pattern is unremarkable.
IMPRESSION: Normal chest x-ray.

## 2022-08-18 DIAGNOSIS — F4323 Adjustment disorder with mixed anxiety and depressed mood: Secondary | ICD-10-CM | POA: Diagnosis not present

## 2022-08-21 ENCOUNTER — Ambulatory Visit (HOSPITAL_BASED_OUTPATIENT_CLINIC_OR_DEPARTMENT_OTHER): Payer: BC Managed Care – PPO

## 2022-08-25 DIAGNOSIS — F4323 Adjustment disorder with mixed anxiety and depressed mood: Secondary | ICD-10-CM | POA: Diagnosis not present

## 2022-09-01 DIAGNOSIS — F4323 Adjustment disorder with mixed anxiety and depressed mood: Secondary | ICD-10-CM | POA: Diagnosis not present

## 2022-09-22 DIAGNOSIS — F4323 Adjustment disorder with mixed anxiety and depressed mood: Secondary | ICD-10-CM | POA: Diagnosis not present

## 2022-09-27 ENCOUNTER — Other Ambulatory Visit: Payer: Self-pay | Admitting: Cardiovascular Disease

## 2022-09-27 ENCOUNTER — Ambulatory Visit (HOSPITAL_BASED_OUTPATIENT_CLINIC_OR_DEPARTMENT_OTHER)
Admission: RE | Admit: 2022-09-27 | Discharge: 2022-09-27 | Disposition: A | Discharge status: Home / Self Care | Payer: BC Managed Care – PPO | Source: Ambulatory Visit | Attending: Cardiovascular Disease | Admitting: Cardiovascular Disease

## 2022-09-27 DIAGNOSIS — I10 Essential (primary) hypertension: Secondary | ICD-10-CM

## 2022-09-27 DIAGNOSIS — E7800 Pure hypercholesterolemia, unspecified: Secondary | ICD-10-CM

## 2022-09-27 NOTE — Telephone Encounter (Signed)
Rx(s) sent to pharmacy electronically.  

## 2022-10-06 DIAGNOSIS — F4323 Adjustment disorder with mixed anxiety and depressed mood: Secondary | ICD-10-CM | POA: Diagnosis not present

## 2022-11-01 DIAGNOSIS — J301 Allergic rhinitis due to pollen: Secondary | ICD-10-CM | POA: Diagnosis not present

## 2022-11-01 DIAGNOSIS — J3089 Other allergic rhinitis: Secondary | ICD-10-CM | POA: Diagnosis not present

## 2022-11-01 DIAGNOSIS — J3081 Allergic rhinitis due to animal (cat) (dog) hair and dander: Secondary | ICD-10-CM | POA: Diagnosis not present

## 2022-11-01 DIAGNOSIS — L500 Allergic urticaria: Secondary | ICD-10-CM | POA: Diagnosis not present

## 2022-11-12 DIAGNOSIS — F4323 Adjustment disorder with mixed anxiety and depressed mood: Secondary | ICD-10-CM | POA: Diagnosis not present

## 2022-11-20 NOTE — Progress Notes (Incomplete)
Advanced Hypertension Clinic Follow-up:    Date:  11/22/2022   ID:  Alexis Clarke, DOB Feb 16, 1976, MRN [id number]  PCP:  Christa See, FNP  Cardiologist:  None  Nephrologist:  Referring MD: Christa See, FNP   CC: Hypertension  History of Present Illness:    Alexis Clarke is a 47 y.o. female with a hx of hypertension, and morbid obesity, here for follow up. She was initially seen 01/27/2022 in the Advanced Hypertension Clinic.  She saw Dr. Garwin Brothers on 07/2021.  At that visit her blood pressure was 160/90 on hydrochlorothiazide.    Her blood pressures were labile and higher in the office than at home. Amlodipine was added and she was encouraged to work on lifestyle changes. She enrolled in our remote patient monitoring study. At follow up visits with our pharmacist her home blood pressures were well controlled but office blood pressures were elevated consistent with white coat hypertension. Amlodipine was reduced and HCTZ was increased due to swelling. Valsartan was added due to elevated diastolic blood pressures.   At her last appointment, blood pressures were well controlled and better at home than in the office. Her swelling had resolved. She was struggling with adherence to her diet and exercise routines. She was encouraged to keep working on this. Lipids were elevated and she was interested in a calcium score. She had a coronary CT 09/2022 revealing a coronary calcium score of 0.  Today, she states she was surprised to see her blood pressure in clinic was 134/92. At home it is closer to 118/76 on average. A couple times she has had lower blood pressures, at which times she didn't take her amlodipine at night. She has some lightheadedness with her low blood pressures, but this has not been frequent. Since her amlodipine was discontinued, her swelling has resolved. Lately she has struggled with adhering to her diet and exercise routines due to stressors such as waiting to close on her house.  When she is able to exercise she feels well. She has never smoked. She denies any palpitations, chest pain, shortness of breath, or peripheral edema. No headaches, syncope, orthopnea, or PND.  Today,  She denies any palpitations, chest pain, shortness of breath, or peripheral edema. No lightheadedness, headaches, syncope, orthopnea, or PND.  (+)  Previous antihypertensives: HCTZ  Past Medical History:  Diagnosis Date   Essential hypertension 01/27/2022   Hypertension    Morbid obesity ([place]) 01/27/2022   Pure hypercholesterolemia 06/05/2022   Sinus tachycardia 01/27/2022    Past Surgical History:  Procedure Laterality Date   TMJ ARTHROPLASTY      Current Medications: No outpatient medications have been marked as taking for the 11/23/22 encounter (Appointment) with Skeet Latch, MD.     Allergies:   Elmer Bales (diagnostic), Other, and Shellfish-derived products   Social History   Socioeconomic History   Marital status: Single    Spouse name: Not on file   Number of children: Not on file   Years of education: Not on file   Highest education level: Not on file  Occupational History   Not on file  Tobacco Use   Smoking status: Never   Smokeless tobacco: Never  Substance and Sexual Activity   Alcohol use: Never   Drug use: Never   Sexual activity: Not on file  Other Topics Concern   Not on file  Social History Narrative   Not on file   Social Determinants of Health   Financial Resource Strain: Low Risk  (  01/27/2022)   Overall Financial Resource Strain (CARDIA)    Difficulty of Paying Living Expenses: Not hard at all  Food Insecurity: No Food Insecurity (01/27/2022)   Hunger Vital Sign    Worried About Running Out of Food in the Last Year: Never true    Ran Out of Food in the Last Year: Never true  Transportation Needs: No Transportation Needs (01/27/2022)   PRAPARE - Hydrologist (Medical): No    Lack of Transportation (Non-Medical): No   Physical Activity: Insufficiently Active (01/27/2022)   Exercise Vital Sign    Days of Exercise per Week: 3 days    Minutes of Exercise per Session: 30 min  Stress: Not on file  Social Connections: Not on file     Family History: The patient's family history includes Hyperlipidemia in her mother; Hypertension in her father and mother.  ROS:   Please see the history of present illness.     All other systems reviewed and are negative.  EKGs/Labs/Other Studies Reviewed:    CT Cardiac Scoring  09/27/2022: IMPRESSION: 1. Coronary calcium score of 0.  EKG:  EKG is personally reviewed. 11/23/2022:  EKG was not ordered. 06/05/2022:  EKG was not ordered. 01/27/2022: sinus tachycardia.  Rate 108 bpm.  Nonspecific T wave abnormalities.  Recent Labs: 04/28/2022: BUN 11; Creatinine, Ser 1.00; Potassium 4.0; Sodium 139   Recent Lipid Panel No results found for: "CHOL", "TRIG", "HDL", "CHOLHDL", "VLDL", "LDLCALC", "LDLDIRECT"  Physical Exam:    VS:  There were no vitals taken for this visit. , BMI There is no height or weight on file to calculate BMI. GENERAL:  Well appearing HEENT: Pupils equal round and reactive, fundi not visualized, oral mucosa unremarkable NECK:  No jugular venous distention, waveform within normal limits, carotid upstroke brisk and symmetric, no bruits, no thyromegaly LUNGS:  Clear to auscultation bilaterally HEART:  RRR.  PMI not displaced or sustained,S1 and S2 within normal limits, no S3, no S4, no clicks, no rubs, no murmurs ABD:  Flat, positive bowel sounds normal in frequency in pitch, no bruits, no rebound, no guarding, no midline pulsatile mass, no hepatomegaly, no splenomegaly EXT:  2 plus pulses throughout, no edema, no cyanosis no clubbing SKIN:  No rashes no nodules NEURO:  Cranial nerves II through XII grossly intact, motor grossly intact throughout PSYCH:  Cognitively intact, oriented to person place and time   ASSESSMENT/PLAN:    No problem-specific  Assessment & Plan notes found for this encounter.  ***Plan: -  Screening for Secondary Hypertension:     01/27/2022    9:18 AM  Causes  Drugs/Herbals Screened     - Comments limiting sodium.  no caffeine. No EtOH.  No NSAIDS.  Sleep Apnea Screened     - Comments snores but no other symtpoms.  Thyroid Disease Screened     - Comments normal with PCP 01/2022  Hyperaldosteronism N/A  Pheochromocytoma N/A  Cushing's Syndrome N/A  Hyperparathyroidism N/A  Coarctation of the Aorta Screened     - Comments BP symmetric  Compliance Screened    Relevant Labs/Studies:    Latest Ref Rng & Units 04/28/2022    9:38 AM  Basic Labs  Sodium 134 - 144 mmol/L 139   Potassium 3.5 - 5.2 mmol/L 4.0   Creatinine 0.57 - 1.00 mg/dL 1.00                    Disposition:    FU with MD/PharmD in ***  6 months.   Medication Adjustments/Labs and Tests Ordered: Current medicines are reviewed at length with the patient today.  Concerns regarding medicines are outlined above.   No orders of the defined types were placed in this encounter.  No orders of the defined types were placed in this encounter.  I,Mathew Stumpf,acting as a Education administrator for Skeet Latch, MD.,have documented all relevant documentation on the behalf of Skeet Latch, MD,as directed by  Skeet Latch, MD while in the presence of Skeet Latch, MD.  I, [place] Linsey, MD have reviewed all documentation for this visit.  The documentation of the exam, diagnosis, procedures, and orders on 11/22/2022 are all accurate and complete.  Waynetta Pean  11/22/2022 8:26 AM    [facility]

## 2022-11-22 ENCOUNTER — Telehealth (HOSPITAL_BASED_OUTPATIENT_CLINIC_OR_DEPARTMENT_OTHER): Payer: Self-pay | Admitting: *Deleted

## 2022-11-22 ENCOUNTER — Encounter (HOSPITAL_BASED_OUTPATIENT_CLINIC_OR_DEPARTMENT_OTHER): Payer: Self-pay | Admitting: *Deleted

## 2022-11-22 NOTE — Telephone Encounter (Signed)
Mychart message sent to patient to inform Dr Oval Linsey needs to cancel appointment tomorrow secondary to having to cancel clinic  Left detailed message on voicemail, ok per [facility]

## 2022-11-23 ENCOUNTER — Ambulatory Visit (HOSPITAL_BASED_OUTPATIENT_CLINIC_OR_DEPARTMENT_OTHER): Payer: BC Managed Care – PPO | Admitting: Cardiovascular Disease

## 2022-12-22 DIAGNOSIS — F4323 Adjustment disorder with mixed anxiety and depressed mood: Secondary | ICD-10-CM | POA: Diagnosis not present

## 2022-12-27 ENCOUNTER — Ambulatory Visit (HOSPITAL_BASED_OUTPATIENT_CLINIC_OR_DEPARTMENT_OTHER): Payer: BC Managed Care – PPO | Admitting: Cardiovascular Disease

## 2022-12-28 ENCOUNTER — Other Ambulatory Visit: Payer: Self-pay | Admitting: Cardiovascular Disease

## 2022-12-28 DIAGNOSIS — I10 Essential (primary) hypertension: Secondary | ICD-10-CM

## 2022-12-28 NOTE — Telephone Encounter (Signed)
Rx(s) sent to pharmacy electronically.  

## 2023-01-11 ENCOUNTER — Encounter (HOSPITAL_BASED_OUTPATIENT_CLINIC_OR_DEPARTMENT_OTHER): Payer: Self-pay | Admitting: Cardiovascular Disease

## 2023-01-11 ENCOUNTER — Ambulatory Visit (HOSPITAL_BASED_OUTPATIENT_CLINIC_OR_DEPARTMENT_OTHER): Payer: BC Managed Care – PPO | Admitting: Cardiovascular Disease

## 2023-01-11 VITALS — BP 132/84 | HR 95 | Ht 62.0 in | Wt 244.0 lb

## 2023-01-11 DIAGNOSIS — E7800 Pure hypercholesterolemia, unspecified: Secondary | ICD-10-CM | POA: Diagnosis not present

## 2023-01-11 DIAGNOSIS — I10 Essential (primary) hypertension: Secondary | ICD-10-CM

## 2023-01-11 MED ORDER — VALSARTAN-HYDROCHLOROTHIAZIDE 160-25 MG PO TABS: 1.0000 | ORAL_TABLET | Freq: Every day | ORAL | 3 refills | Status: DC

## 2023-01-11 NOTE — Progress Notes (Deleted)
Advanced Hypertension Clinic Follow-up:    Date:  01/11/2023   ID:  Alexis Clarke, DOB 01/17/1976, MRN [id number]  PCP:  Ayesha Rumpf, FNP  Cardiologist:  None  Nephrologist:  Referring MD: Ayesha Rumpf, FNP   CC: Hypertension  History of Present Illness:    Alexis Clarke is a 47 y.o. female with a hx of hypertension, and morbid obesity, here for follow up. She was initially seen 01/27/2022 in the Advanced Hypertension Clinic.  She saw Dr. Cherly Hensen on 07/2021.  At that visit her blood pressure was 160/90 on hydrochlorothiazide.    Her blood pressures are labile and higher in the office than at home. Amlodipine was added and she was encouraged to work on lifestyle changes. She enrolled in our remote patient monitoring study. At follow up visits with our pharmacist her home blood pressures were well controlled but office blood pressures were elevated consistent with white coat hypertension. Amlodipine was reduced and HCTZ was increased due to swelling. Valsartan was added due to elevated diastolic blood pressures.  At the last visit 05/2022 home blood pressures were in the 1 teens over 70s, though it was 134/92 in the office.  She was encouraged to keep of lifestyle interventions and no medication changes were made.    Previous antihypertensives: HCTZ  Past Medical History:  Diagnosis Date   Essential hypertension 01/27/2022   Hypertension    Morbid obesity (HCC) 01/27/2022   Pure hypercholesterolemia 06/05/2022   Sinus tachycardia 01/27/2022    Past Surgical History:  Procedure Laterality Date   TMJ ARTHROPLASTY      Current Medications: No outpatient medications have been marked as taking for the 01/11/23 encounter (Appointment) with Chilton Si, MD.     Allergies:   Barron Schmid (diagnostic), Other, and Shellfish-derived products   Social History   Socioeconomic History   Marital status: Single    Spouse name: Not on file   Number of children: Not on file   Years of  education: Not on file   Highest education level: Not on file  Occupational History   Not on file  Tobacco Use   Smoking status: Never   Smokeless tobacco: Never  Substance and Sexual Activity   Alcohol use: Never   Drug use: Never   Sexual activity: Not on file  Other Topics Concern   Not on file  Social History Narrative   Not on file   Social Determinants of Health   Financial Resource Strain: Low Risk  (01/27/2022)   Overall Financial Resource Strain (CARDIA)    Difficulty of Paying Living Expenses: Not hard at all  Food Insecurity: No Food Insecurity (01/27/2022)   Hunger Vital Sign    Worried About Running Out of Food in the Last Year: Never true    Ran Out of Food in the Last Year: Never true  Transportation Needs: No Transportation Needs (01/27/2022)   PRAPARE - Administrator, Civil Service (Medical): No    Lack of Transportation (Non-Medical): No  Physical Activity: Insufficiently Active (01/27/2022)   Exercise Vital Sign    Days of Exercise per Week: 3 days    Minutes of Exercise per Session: 30 min  Stress: Not on file  Social Connections: Not on file     Family History: The patient's family history includes Hyperlipidemia in her mother; Hypertension in her father and mother.  ROS:   Please see the history of present illness.    (+) Lightheadedness associated with low BP All  other systems reviewed and are negative.  EKGs/Labs/Other Studies Reviewed:    EKG:  EKG is personally reviewed. 06/05/2022:  EKG was not ordered. 01/27/2022: sinus tachycardia.  Rate 108 bpm.  Nonspecific T wave abnormalities.  Recent Labs: 04/28/2022: BUN 11; Creatinine, Ser 1.00; Potassium 4.0; Sodium 139   Recent Lipid Panel No results found for: "CHOL", "TRIG", "HDL", "CHOLHDL", "VLDL", "LDLCALC", "LDLDIRECT"  Physical Exam:    VS:  There were no vitals taken for this visit. , BMI There is no height or weight on file to calculate BMI. GENERAL:  Well  appearing HEENT: Pupils equal round and reactive, fundi not visualized, oral mucosa unremarkable NECK:  No jugular venous distention, waveform within normal limits, carotid upstroke brisk and symmetric, no bruits, no thyromegaly LUNGS:  Clear to auscultation bilaterally HEART:  RRR.  PMI not displaced or sustained,S1 and S2 within normal limits, no S3, no S4, no clicks, no rubs, no murmurs ABD:  Flat, positive bowel sounds normal in frequency in pitch, no bruits, no rebound, no guarding, no midline pulsatile mass, no hepatomegaly, no splenomegaly EXT:  2 plus pulses throughout, no edema, no cyanosis no clubbing SKIN:  No rashes no nodules NEURO:  Cranial nerves II through XII grossly intact, motor grossly intact throughout PSYCH:  Cognitively intact, oriented to person place and time   ASSESSMENT/PLAN:    No problem-specific Assessment & Plan notes found for this encounter.     Screening for Secondary Hypertension:     01/27/2022    9:18 AM  Causes  Drugs/Herbals Screened     - Comments limiting sodium.  no caffeine. No EtOH.  No NSAIDS.  Sleep Apnea Screened     - Comments snores but no other symtpoms.  Thyroid Disease Screened     - Comments normal with PCP 01/2022  Hyperaldosteronism N/A  Pheochromocytoma N/A  Cushing's Syndrome N/A  Hyperparathyroidism N/A  Coarctation of the Aorta Screened     - Comments BP symmetric  Compliance Screened    Relevant Labs/Studies:    Latest Ref Rng & Units 04/28/2022    9:38 AM  Basic Labs  Sodium 134 - 144 mmol/L 139   Potassium 3.5 - 5.2 mmol/L 4.0   Creatinine 0.57 - 1.00 mg/dL 6.04                    Disposition:    FU with MD/PharmD in 6 months.    Medication Adjustments/Labs and Tests Ordered: Current medicines are reviewed at length with the patient today.  Concerns regarding medicines are outlined above.   No orders of the defined types were placed in this encounter.  No orders of the defined types were placed in  this encounter.  I,Mathew Stumpf,acting as a Neurosurgeon for Chilton Si, MD.,have documented all relevant documentation on the behalf of Chilton Si, MD,as directed by  Chilton Si, MD while in the presence of Chilton Si, MD.  I, Hena Ewalt C. Duke Salvia, MD have reviewed all documentation for this visit.  The documentation of the exam, diagnosis, procedures, and orders on 01/11/2023 are all accurate and complete.   Signed, Chilton Si, MD  01/11/2023 8:09 AM    [facility]

## 2023-01-11 NOTE — Progress Notes (Signed)
Advanced Hypertension Clinic Follow-up:    Date:  01/11/2023   ID:  Alexis Clarke, DOB [birth year], MRN [id number]  PCP:  Ayesha Rumpf, FNP  Cardiologist:  None  Nephrologist:  Referring MD: Ayesha Rumpf, FNP   CC: Hypertension  History of Present Illness:    Alexis Clarke is a 47 y.o. female with a hx of hypertension, and morbid obesity, here for follow up. She was initially seen 01/27/2022 in the Advanced Hypertension Clinic.  She saw Dr. Cherly Hensen on 07/2021.  At that visit her blood pressure was 160/90 on hydrochlorothiazide.    Her blood pressures are labile and higher in the office than at home. Amlodipine was added and she was encouraged to work on lifestyle changes. She enrolled in our remote patient monitoring study. At follow up visits with our pharmacist her home blood pressures were well controlled but office blood pressures were elevated consistent with white coat hypertension. Amlodipine was reduced and HCTZ was increased due to swelling. Valsartan was added due to elevated diastolic blood pressures.  At the last visit 05/2022 home blood pressures were in the 1 teens over 70s, though it was 134/92 in the office.  She was encouraged to keep of lifestyle interventions and no medication changes were made.    Previous antihypertensives: HCTZ  Past Medical History:  Diagnosis Date   Essential hypertension 01/27/2022   Hypertension    Morbid obesity 01/27/2022   Pure hypercholesterolemia 06/05/2022   Sinus tachycardia 01/27/2022    Past Surgical History:  Procedure Laterality Date   TMJ ARTHROPLASTY      Current Medications: Current Meds  Medication Sig   cetirizine (ZYRTEC) 10 MG tablet Take 1 tablet by mouth as needed.   EPINEPHrine (EPIPEN 2-PAK) 0.3 mg/0.3 mL IJ SOAJ injection See admin instructions.   fluticasone (FLONASE SENSIMIST) 27.5 MCG/SPRAY nasal spray 2 sprays each nostril   Olopatadine HCl 0.6 % SOLN Place 2 sprays into both nostrils 2 (two) times  daily.   valsartan-hydrochlorothiazide (DIOVAN HCT) 160-25 MG tablet Take 1 tablet by mouth daily.   [DISCONTINUED] hydrochlorothiazide (HYDRODIURIL) 25 MG tablet TAKE 1 TABLET (25 MG TOTAL) BY MOUTH DAILY.   [DISCONTINUED] valsartan (DIOVAN) 80 MG tablet TAKE 1 TABLET BY MOUTH EVERY DAY     Allergies:   Almond (diagnostic), Other, and Shellfish-derived products   Social History   Socioeconomic History   Marital status: Single    Spouse name: Not on file   Number of children: Not on file   Years of education: Not on file   Highest education level: Not on file  Occupational History   Not on file  Tobacco Use   Smoking status: Never   Smokeless tobacco: Never  Substance and Sexual Activity   Alcohol use: Never   Drug use: Never   Sexual activity: Not on file  Other Topics Concern   Not on file  Social History Narrative   Not on file   Social Determinants of Health   Financial Resource Strain: Low Risk  (01/27/2022)   Overall Financial Resource Strain (CARDIA)    Difficulty of Paying Living Expenses: Not hard at all  Food Insecurity: No Food Insecurity (01/27/2022)   Hunger Vital Sign    Worried About Running Out of Food in the Last Year: Never true    Ran Out of Food in the Last Year: Never true  Transportation Needs: No Transportation Needs (01/27/2022)   PRAPARE - Administrator, Civil Service (Medical): No  Lack of Transportation (Non-Medical): No  Physical Activity: Insufficiently Active (01/27/2022)   Exercise Vital Sign    Days of Exercise per Week: 3 days    Minutes of Exercise per Session: 30 min  Stress: Not on file  Social Connections: Not on file     Family History: The patient's family history includes Hyperlipidemia in her mother; Hypertension in her father and mother.  ROS:   Please see the history of present illness.    (+) Lightheadedness associated with low BP All other systems reviewed and are negative.  EKGs/Labs/Other Studies  Reviewed:    EKG:  EKG is personally reviewed. 06/05/2022:  EKG was not ordered. 01/27/2022: sinus tachycardia.  Rate 108 bpm.  Nonspecific T wave abnormalities.  Recent Labs: 04/28/2022: BUN 11; Creatinine, Ser 1.00; Potassium 4.0; Sodium 139   Recent Lipid Panel No results found for: "CHOL", "TRIG", "HDL", "CHOLHDL", "VLDL", "LDLCALC", "LDLDIRECT"  Physical Exam:    VS:  BP 132/84 (BP Location: Right Arm, Patient Position: Sitting, Cuff Size: Large)   Pulse 95   Ht 5\' 2"  (1.575 m)   Wt 244 lb (110.7 kg)   SpO2 100%   BMI 44.63 kg/m  , BMI Body mass index is 44.63 kg/m. GENERAL:  Well appearing HEENT: Pupils equal round and reactive, fundi not visualized, oral mucosa unremarkable NECK:  No jugular venous distention, waveform within normal limits, carotid upstroke brisk and symmetric, no bruits, no thyromegaly LUNGS:  Clear to auscultation bilaterally HEART:  RRR.  PMI not displaced or sustained,S1 and S2 within normal limits, no S3, no S4, no clicks, no rubs, no murmurs ABD:  Flat, positive bowel sounds normal in frequency in pitch, no bruits, no rebound, no guarding, no midline pulsatile mass, no hepatomegaly, no splenomegaly EXT:  2 plus pulses throughout, no edema, no cyanosis no clubbing SKIN:  No rashes no nodules NEURO:  Cranial nerves II through XII grossly intact, motor grossly intact throughout PSYCH:  Cognitively intact, oriented to person place and time   ASSESSMENT/PLAN:    Essential hypertension She is no longer taking amlodipine.  Blood pressures are hovering in the low 130s over 80s.  She is going to work on increasing her exercise and limiting her sodium intake.  We will stop the amlodipine and change her valsartan/HCTZ to a combination of 25/160.  She will keep checking her blood pressures at home.  She sees her PCP next week so we will not repeat a BMP at this time.  Pure hypercholesterolemia ASCVD 10-year risk does not warrant medication.  Keep working on diet  and exercise.  Morbid obesity (HCC) She is interested in Ozempic.  She sees her PCP next week and will have a hemoglobin A1c checked.  We discussed the fact that she would not qualify based on her insurance at this time.  We did discuss Zepbound as a possibility given they are more flexible assistance programs.     Screening for Secondary Hypertension:     01/27/2022    9:18 AM  Causes  Drugs/Herbals Screened     - Comments limiting sodium.  no caffeine. No EtOH.  No NSAIDS.  Sleep Apnea Screened     - Comments snores but no other symtpoms.  Thyroid Disease Screened     - Comments normal with PCP 01/2022  Hyperaldosteronism N/A  Pheochromocytoma N/A  Cushing's Syndrome N/A  Hyperparathyroidism N/A  Coarctation of the Aorta Screened     - Comments BP symmetric  Compliance Screened    Relevant  Labs/Studies:    Latest Ref Rng & Units 04/28/2022    9:38 AM  Basic Labs  Sodium 134 - 144 mmol/L 139   Potassium 3.5 - 5.2 mmol/L 4.0   Creatinine 0.57 - 1.00 mg/dL 1.61                    Disposition:    FU with MD/PharmD in 6 months.    Medication Adjustments/Labs and Tests Ordered: Current medicines are reviewed at length with the patient today.  Concerns regarding medicines are outlined above.   No orders of the defined types were placed in this encounter.  Meds ordered this encounter  Medications   valsartan-hydrochlorothiazide (DIOVAN HCT) 160-25 MG tablet    Sig: Take 1 tablet by mouth daily.    Dispense:  90 tablet    Refill:  3    I,Mathew Stumpf,acting as a scribe for Chilton Si, MD.,have documented all relevant documentation on the behalf of Chilton Si, MD,as directed by  Chilton Si, MD while in the presence of Chilton Si, MD.  I, Jabier Deese C. Duke Salvia, MD have reviewed all documentation for this visit.  The documentation of the exam, diagnosis, procedures, and orders on 01/11/2023 are all accurate and complete.   Signed, Chilton Si, MD  01/11/2023 4:19 PM    [facility]

## 2023-01-11 NOTE — Patient Instructions (Addendum)
Medication Instructions:  STOP- Amlodipine STOP- Valsartan STOP- Hydrochlorothiazide START- Valsartan/HCTz 160/25 mg by mouth daily  *If you need a refill on your cardiac medications before your next appointment, please call your pharmacy*   Lab Work: None Ordered   Testing/Procedures: None Ordered   Follow-Up: At [facility], you and your health needs are our priority.  As part of our continuing mission to provide you with exceptional heart care, we have created designated Provider Care Teams.  These Care Teams include your primary Cardiologist (physician) and Advanced Practice Providers (APPs -  Physician Assistants and Nurse Practitioners) who all work together to provide you with the care you need, when you need it.  We recommend signing up for the patient portal called "MyChart".  Sign up information is provided on this After Visit Summary.  MyChart is used to connect with patients for Virtual Visits (Telemedicine).  Patients are able to view lab/test results, encounter notes, upcoming appointments, etc.  Non-urgent messages can be sent to your provider as well.   To learn more about what you can do with MyChart, go to [email or web address].    Your next appointment:   6 month(s)  Provider:   Chilton Si, MD    Other Instructions

## 2023-01-11 NOTE — Assessment & Plan Note (Signed)
She is interested in Ozempic.  She sees her PCP next week and will have a hemoglobin A1c checked.  We discussed the fact that she would not qualify based on her insurance at this time.  We did discuss Zepbound as a possibility given they are more flexible assistance programs.

## 2023-01-11 NOTE — Assessment & Plan Note (Signed)
ASCVD 10-year risk does not warrant medication.  Keep working on diet and exercise.

## 2023-01-11 NOTE — Assessment & Plan Note (Signed)
She is no longer taking amlodipine.  Blood pressures are hovering in the low 130s over 80s.  She is going to work on increasing her exercise and limiting her sodium intake.  We will stop the amlodipine and change her valsartan/HCTZ to a combination of 25/160.  She will keep checking her blood pressures at home.  She sees her PCP next week so we will not repeat a BMP at this time.

## 2023-01-19 ENCOUNTER — Other Ambulatory Visit: Payer: Self-pay | Admitting: Family Medicine

## 2023-01-19 DIAGNOSIS — Z79899 Other long term (current) drug therapy: Secondary | ICD-10-CM | POA: Diagnosis not present

## 2023-01-19 DIAGNOSIS — E010 Iodine-deficiency related diffuse (endemic) goiter: Secondary | ICD-10-CM | POA: Diagnosis not present

## 2023-01-19 DIAGNOSIS — I10 Essential (primary) hypertension: Secondary | ICD-10-CM | POA: Diagnosis not present

## 2023-01-19 DIAGNOSIS — R0683 Snoring: Secondary | ICD-10-CM | POA: Diagnosis not present

## 2023-01-19 DIAGNOSIS — Z0000 Encounter for general adult medical examination without abnormal findings: Secondary | ICD-10-CM | POA: Diagnosis not present

## 2023-01-19 DIAGNOSIS — E7800 Pure hypercholesterolemia, unspecified: Secondary | ICD-10-CM | POA: Diagnosis not present

## 2023-02-16 ENCOUNTER — Ambulatory Visit
Admission: RE | Admit: 2023-02-16 | Discharge: 2023-02-16 | Disposition: A | Discharge status: Home / Self Care | Payer: BC Managed Care – PPO | Source: Ambulatory Visit | Attending: Family Medicine | Admitting: Family Medicine

## 2023-02-16 DIAGNOSIS — E010 Iodine-deficiency related diffuse (endemic) goiter: Secondary | ICD-10-CM

## 2023-02-16 DIAGNOSIS — R221 Localized swelling, mass and lump, neck: Secondary | ICD-10-CM | POA: Diagnosis not present

## 2023-03-01 DIAGNOSIS — E669 Obesity, unspecified: Secondary | ICD-10-CM | POA: Diagnosis not present

## 2023-03-01 DIAGNOSIS — Z713 Dietary counseling and surveillance: Secondary | ICD-10-CM | POA: Diagnosis not present

## 2023-03-01 DIAGNOSIS — Z6841 Body Mass Index (BMI) 40.0 and over, adult: Secondary | ICD-10-CM | POA: Diagnosis not present

## 2023-03-02 DIAGNOSIS — F4323 Adjustment disorder with mixed anxiety and depressed mood: Secondary | ICD-10-CM | POA: Diagnosis not present

## 2023-04-06 DIAGNOSIS — Z713 Dietary counseling and surveillance: Secondary | ICD-10-CM | POA: Diagnosis not present

## 2023-04-06 DIAGNOSIS — Z6841 Body Mass Index (BMI) 40.0 and over, adult: Secondary | ICD-10-CM | POA: Diagnosis not present

## 2023-04-06 DIAGNOSIS — K5900 Constipation, unspecified: Secondary | ICD-10-CM | POA: Diagnosis not present

## 2023-04-06 DIAGNOSIS — E669 Obesity, unspecified: Secondary | ICD-10-CM | POA: Diagnosis not present

## 2023-05-11 DIAGNOSIS — F4323 Adjustment disorder with mixed anxiety and depressed mood: Secondary | ICD-10-CM | POA: Diagnosis not present

## 2023-05-25 DIAGNOSIS — F4323 Adjustment disorder with mixed anxiety and depressed mood: Secondary | ICD-10-CM | POA: Diagnosis not present

## 2023-06-08 DIAGNOSIS — F4323 Adjustment disorder with mixed anxiety and depressed mood: Secondary | ICD-10-CM | POA: Diagnosis not present

## 2023-06-22 DIAGNOSIS — F4323 Adjustment disorder with mixed anxiety and depressed mood: Secondary | ICD-10-CM | POA: Diagnosis not present

## 2023-06-25 DIAGNOSIS — Z1231 Encounter for screening mammogram for malignant neoplasm of breast: Secondary | ICD-10-CM | POA: Diagnosis not present

## 2023-06-27 DIAGNOSIS — Z30431 Encounter for routine checking of intrauterine contraceptive device: Secondary | ICD-10-CM | POA: Diagnosis not present

## 2023-06-27 DIAGNOSIS — Z01419 Encounter for gynecological examination (general) (routine) without abnormal findings: Secondary | ICD-10-CM | POA: Diagnosis not present

## 2023-06-27 DIAGNOSIS — I10 Essential (primary) hypertension: Secondary | ICD-10-CM | POA: Diagnosis not present

## 2023-07-05 DIAGNOSIS — Z6841 Body Mass Index (BMI) 40.0 and over, adult: Secondary | ICD-10-CM | POA: Diagnosis not present

## 2023-07-05 DIAGNOSIS — Z713 Dietary counseling and surveillance: Secondary | ICD-10-CM | POA: Diagnosis not present

## 2023-07-05 DIAGNOSIS — Z23 Encounter for immunization: Secondary | ICD-10-CM | POA: Diagnosis not present

## 2023-07-05 DIAGNOSIS — E66813 Obesity, class 3: Secondary | ICD-10-CM | POA: Diagnosis not present

## 2023-07-13 ENCOUNTER — Encounter (HOSPITAL_BASED_OUTPATIENT_CLINIC_OR_DEPARTMENT_OTHER): Payer: Self-pay | Admitting: Cardiovascular Disease

## 2023-07-13 ENCOUNTER — Ambulatory Visit (HOSPITAL_BASED_OUTPATIENT_CLINIC_OR_DEPARTMENT_OTHER): Payer: BC Managed Care – PPO | Admitting: Cardiovascular Disease

## 2023-07-13 VITALS — BP 128/80 | HR 75 | Ht 62.0 in | Wt 228.0 lb

## 2023-07-13 DIAGNOSIS — E7800 Pure hypercholesterolemia, unspecified: Secondary | ICD-10-CM

## 2023-07-13 DIAGNOSIS — I10 Essential (primary) hypertension: Secondary | ICD-10-CM

## 2023-07-13 DIAGNOSIS — F4323 Adjustment disorder with mixed anxiety and depressed mood: Secondary | ICD-10-CM | POA: Diagnosis not present

## 2023-07-13 DIAGNOSIS — R000 Tachycardia, unspecified: Secondary | ICD-10-CM | POA: Diagnosis not present

## 2023-07-13 NOTE — Progress Notes (Signed)
Cardiology Office Note:  .   Date:  07/13/2023  ID:  Jobe Marker, DOB 03/28/1976, MRN [id number] PCP: Ayesha Rumpf, FNP  [facility] Providers Cardiologist:  None    History of Present Illness: .   Alexis Clarke is a 47 y.o. female with a hx of hypertension, SVT, and morbid obesity, here for follow up. She was initially seen 01/27/2022 in the Advanced Hypertension Clinic.  She saw Dr. Cherly Hensen on 07/2021.  At that visit her blood pressure was 160/90 on hydrochlorothiazide.     Her blood pressures are labile and higher in the office than at home. Amlodipine was added and she was encouraged to work on lifestyle changes. She enrolled in our remote patient monitoring study. At follow up visits with our pharmacist her home blood pressures were well controlled but office blood pressures were elevated consistent with white coat hypertension. Amlodipine was reduced and HCTZ was increased due to swelling. Valsartan was added due to elevated diastolic blood pressures.  On 05/2022 home blood pressures were in the 110s/70s, though it was 134/92 in the office.  She was encouraged to keep of lifestyle interventions and no medication changes were made.  However, at the visit 12/2022 blood pressure had increased to the 130s over 80s.  Amlodipine was discontinued and she was increased to valsartan/HCTZ 160/25 mg.  Ms. Dowie, has been managing her health well with regular exercise and medication. She reports attending the gym three to four times a week and participating in weekly yoga sessions. She has noticed a significant improvement in her well-being with this regimen and has not experienced any issues during exercise. Her blood pressure readings have been consistently around 117/75-80, both at home and during recent doctor visits.  The patient has been on Qsymia for weight loss and has noticed some success, although she reports periods of stagnation. She recently had her dosage increased but has not yet  started the new dose. The medication has resulted in a decreased appetite, requiring the patient to consciously remember to eat. She also reports occasional constipation. The patient has been mindful of her diet, avoiding salty foods due to noticeable fluid retention, particularly on days with more physical activity.  Her cholesterol was found to be high during a check-up in May, but no medication changes were recommended at that time.  Ca score 0. The patient is concerned about the potential for Qsymia to cause heart racing, but she has not experienced this side effect on her current dosage.     ROS:  As per HPI.  Studies Reviewed: Marland Kitchen   EKG Interpretation Date/Time:  Friday July 13 2023 08:59:03 EDT Ventricular Rate:  75 PR Interval:  150 QRS Duration:  80 QT Interval:  382 QTC Calculation: 426 R Axis:   26  Text Interpretation: Normal sinus rhythm Normal ECG No previous ECGs available Confirmed by Chilton Si ([id number]) on 07/13/2023 9:05:01 AM    Risk Assessment/Calculations:             Physical Exam:   VS:  BP 128/80   Pulse 75   Ht 5\' 2"  (1.575 m)   Wt 228 lb (103.4 kg)   SpO2 96%   BMI 41.70 kg/m    Wt Readings from Last 3 Encounters:  07/13/23 228 lb (103.4 kg)  01/11/23 244 lb (110.7 kg)  06/05/22 251 lb 14.4 oz (114.3 kg)    GEN: Well nourished, well developed in no acute distress NECK: No JVD; No carotid bruits CARDIAC:  RRR, no murmurs, rubs, gallops RESPIRATORY:  Clear to auscultation without rales, wheezing or rhonchi  ABDOMEN: Soft, non-tender, non-distended EXTREMITIES:  No edema; No deformity   ASSESSMENT AND PLAN: .    # Hypertension Well controlled with Valsartan Hydrochlorothiazide. No symptoms during exercise. Home readings between 117-120/75-80. -Continue current medication regimen.  # Hyperlipidemia Elevated cholesterol noted in May. Discussed 10-year risk of heart attack or stroke based on current risk factors. Calcium score was zero  indicating no plaque development. -Continue current lifestyle modifications including exercise and diet with limited animal products. -Repeat calcium score in 5-7 years.  # Weight Management Patient on Qsymia for weight loss. Recent increase in dose due to perceived stagnation in weight loss. Some concerns about potential heart rate increase with Qsymia. -Continue Qsymia as tolerated. Monitor for symptoms of increased heart rate. -Follow up in 6 months or sooner if needed.         Dispo: f/u 6 months.  Signed, Chilton Si, MD

## 2023-07-13 NOTE — Patient Instructions (Signed)
Medication Instructions:  Your physician recommends that you continue on your current medications as directed. Please refer to the Current Medication list given to you today.  *If you need a refill on your cardiac medications before your next appointment, please call your pharmacy*  Lab Work: NONE  Testing/Procedures: NONE  Follow-Up: At [facility], you and your health needs are our priority.  As part of our continuing mission to provide you with exceptional heart care, we have created designated Provider Care Teams.  These Care Teams include your primary Cardiologist (physician) and Advanced Practice Providers (APPs -  Physician Assistants and Nurse Practitioners) who all work together to provide you with the care you need, when you need it.  We recommend signing up for the patient portal called "MyChart".  Sign up information is provided on this After Visit Summary.  MyChart is used to connect with patients for Virtual Visits (Telemedicine).  Patients are able to view lab/test results, encounter notes, upcoming appointments, etc.  Non-urgent messages can be sent to your provider as well.   To learn more about what you can do with MyChart, go to [email or web address].    Your next appointment:   12 month(s)  The format for your next appointment:   In Person  Provider:   Dr Duke Salvia or Ronn Melena NP

## 2023-07-30 ENCOUNTER — Encounter: Payer: Self-pay | Admitting: Emergency Medicine

## 2023-07-30 ENCOUNTER — Other Ambulatory Visit: Payer: Self-pay

## 2023-07-30 ENCOUNTER — Ambulatory Visit: Payer: BC Managed Care – PPO

## 2023-07-30 ENCOUNTER — Ambulatory Visit
Admission: EM | Admit: 2023-07-30 | Discharge: 2023-07-30 | Disposition: A | Discharge status: Home / Self Care | Payer: BC Managed Care – PPO | Attending: Internal Medicine | Admitting: Internal Medicine

## 2023-07-30 DIAGNOSIS — M79644 Pain in right finger(s): Secondary | ICD-10-CM

## 2023-07-30 DIAGNOSIS — S6701XA Crushing injury of right thumb, initial encounter: Secondary | ICD-10-CM

## 2023-07-30 DIAGNOSIS — S6010XA Contusion of unspecified finger with damage to nail, initial encounter: Secondary | ICD-10-CM

## 2023-07-30 NOTE — ED Provider Notes (Signed)
[facility]    CSN: [id number] Arrival date & time: 07/30/23  0856      History   Chief Complaint Chief Complaint  Patient presents with   Finger Injury    HPI MADASIN DEMELLO is a 47 y.o. female.   Patient presents today with right thumb injury that occurred about 3 days ago.  Reports that she accidentally closed her thumb in the car door.  She has taken ibuprofen for pain.  Patient is not reporting any numbness or tingling. She does not take any blood thinning medications.      Past Medical History:  Diagnosis Date   Essential hypertension 01/27/2022   Hypertension    Morbid obesity (HCC) 01/27/2022   Pure hypercholesterolemia 06/05/2022   Sinus tachycardia 01/27/2022    Patient Active Problem List   Diagnosis Date Noted   Pure hypercholesterolemia 06/05/2022   Allergic rhinitis due to animal (cat) (dog) hair and dander 03/01/2022   Allergic rhinitis due to pollen 03/01/2022   Allergic urticaria 03/01/2022   Chronic allergic conjunctivitis 03/01/2022   Essential hypertension 01/27/2022   Morbid obesity (HCC) 01/27/2022   Sinus tachycardia 01/27/2022    Past Surgical History:  Procedure Laterality Date   TMJ ARTHROPLASTY      OB History   No obstetric history on file.      Home Medications    Prior to Admission medications   Medication Sig Start Date End Date Taking? Authorizing Provider  cetirizine (ZYRTEC) 10 MG tablet Take 1 tablet by mouth as needed.    [provider]  EPINEPHrine (EPIPEN 2-PAK) 0.3 mg/0.3 mL IJ SOAJ injection See admin instructions.    [provider]  fluticasone (FLONASE SENSIMIST) 27.5 MCG/SPRAY nasal spray 2 sprays each nostril 02/07/18   [provider]  Olopatadine HCl 0.6 % SOLN Place 2 sprays into both nostrils 2 (two) times daily.    [provider]  QSYMIA 7.5-46 MG CP24 Take 1 capsule by mouth daily. 05/18/23   [provider]  valsartan-hydrochlorothiazide (DIOVAN  HCT) 160-25 MG tablet Take 1 tablet by mouth daily. 01/11/23   Chilton Si, MD    Family History Family History  Problem Relation Age of Onset   Hyperlipidemia Mother    Hypertension Mother    Hypertension Father     Social History Social History   Tobacco Use   Smoking status: Never   Smokeless tobacco: Never  Substance Use Topics   Alcohol use: Never   Drug use: Never     Allergies   Almond (diagnostic), Other, and Shellfish-derived products   Review of Systems Review of Systems Per HPI  Physical Exam Triage Vital Signs ED Triage Vitals [07/30/23 0906]  Encounter Vitals Group     BP (!) 140/91     Systolic BP Percentile      Diastolic BP Percentile      Pulse Rate 79     Resp 18     Temp 98.6 F (37 C)     Temp Source Oral     SpO2 97 %     Weight      Height      Head Circumference      Peak Flow      Pain Score 5     Pain Loc      Pain Education      Exclude from Growth Chart    No data found.  Updated Vital Signs BP (!) 140/91 (BP Location: Left Arm)  Pulse 79   Temp 98.6 F (37 C) (Oral)   Resp 18   SpO2 97%   Visual Acuity Right Eye Distance:   Left Eye Distance:   Bilateral Distance:    Right Eye Near:   Left Eye Near:    Bilateral Near:     Physical Exam Constitutional:      General: She is not in acute distress.    Appearance: Normal appearance. She is not toxic-appearing or diaphoretic.  HENT:     Head: Normocephalic and atraumatic.  Eyes:     Extraocular Movements: Extraocular movements intact.     Conjunctiva/sclera: Conjunctivae normal.  Pulmonary:     Effort: Pulmonary effort is normal.  Musculoskeletal:     Comments: Patient has tenderness to palpation to distal end of right thumb.  Nail is intact but there is a subungual hematoma noted to the proximal portion of the nailbed.  No abrasions or lacerations noted.  Capillary refill and pulses intact.  Full range of motion of thumb present.  Neurological:      General: No focal deficit present.     Mental Status: She is alert and oriented to person, place, and time. Mental status is at baseline.  Psychiatric:        Mood and Affect: Mood normal.        Behavior: Behavior normal.        Thought Content: Thought content normal.        Judgment: Judgment normal.      UC Treatments / Results  Labs (all labs ordered are listed, but only abnormal results are displayed) Labs Reviewed - No data to display  EKG   Radiology DG Finger Thumb Right  Result Date: 07/30/2023 CLINICAL DATA:  Thumb injury, pain EXAM: RIGHT THUMB 2+V COMPARISON:  None Available. FINDINGS: There is no evidence of fracture or dislocation. There is no evidence of arthropathy or other focal bone abnormality. Soft tissues are unremarkable. IMPRESSION: No acute abnormality by plain radiography Electronically Signed   By: Judie Petit.  Shick M.D.   On: 07/30/2023 10:38    Procedures Procedures (including critical care time)  Medications Ordered in UC Medications - No data to display  Initial Impression / Assessment and Plan / UC Course  I have reviewed the triage vital signs and the nursing notes.  Pertinent labs & imaging results that were available during my care of the patient were reviewed by me and considered in my medical decision making (see chart for details).     X-ray negative for any acute bony abnormality.  Patient was placed in splint prior to discharge while awaiting x-ray results.  Called patient and discussed x-ray results and that splint is not necessary at this time given negative x-ray results.  Advised supportive care in the meantime.  Subungual hematoma was drained prior to discharge and patient reporting improvement in pain.  Patient is neurovascularly intact with no abrasions or lacerations so do not think that emergent evaluation is necessary.  Although, discussed with patient following up with hand specialty if pain persists or worsens.  Patient verbalized  understanding and was agreeable with plan. Final Clinical Impressions(s) / UC Diagnoses   Final diagnoses:  Pain of right thumb  Crushing injury of right thumb, initial encounter  Subungual hematoma of digit of hand, initial encounter     Discharge Instructions      I will call with final x-ray results.  Follow-up with orthopedist if pain persists or worsens.  ED Prescriptions   None    PDMP not reviewed this encounter.   Gustavus Bryant, [place] 07/30/23 1050

## 2023-07-30 NOTE — Discharge Instructions (Signed)
I will call with final x-ray results.  Follow-up with orthopedist if pain persists or worsens.

## 2023-07-30 NOTE — ED Triage Notes (Signed)
Pt here after smashing right thumb in door 3 days ago; bruising noted with pain under nail

## 2023-08-03 DIAGNOSIS — F4323 Adjustment disorder with mixed anxiety and depressed mood: Secondary | ICD-10-CM | POA: Diagnosis not present

## 2023-08-09 DIAGNOSIS — Z713 Dietary counseling and surveillance: Secondary | ICD-10-CM | POA: Diagnosis not present

## 2023-08-09 DIAGNOSIS — Z6839 Body mass index (BMI) 39.0-39.9, adult: Secondary | ICD-10-CM | POA: Diagnosis not present

## 2023-08-09 DIAGNOSIS — K219 Gastro-esophageal reflux disease without esophagitis: Secondary | ICD-10-CM | POA: Diagnosis not present

## 2023-08-24 DIAGNOSIS — F4323 Adjustment disorder with mixed anxiety and depressed mood: Secondary | ICD-10-CM | POA: Diagnosis not present

## 2023-09-07 DIAGNOSIS — F4323 Adjustment disorder with mixed anxiety and depressed mood: Secondary | ICD-10-CM | POA: Diagnosis not present

## 2023-09-14 DIAGNOSIS — F4323 Adjustment disorder with mixed anxiety and depressed mood: Secondary | ICD-10-CM | POA: Diagnosis not present

## 2023-09-21 DIAGNOSIS — F4323 Adjustment disorder with mixed anxiety and depressed mood: Secondary | ICD-10-CM | POA: Diagnosis not present

## 2023-09-28 DIAGNOSIS — F4323 Adjustment disorder with mixed anxiety and depressed mood: Secondary | ICD-10-CM | POA: Diagnosis not present

## 2023-10-11 DIAGNOSIS — Z713 Dietary counseling and surveillance: Secondary | ICD-10-CM | POA: Diagnosis not present

## 2023-10-11 DIAGNOSIS — Z6838 Body mass index (BMI) 38.0-38.9, adult: Secondary | ICD-10-CM | POA: Diagnosis not present

## 2023-10-12 DIAGNOSIS — F4323 Adjustment disorder with mixed anxiety and depressed mood: Secondary | ICD-10-CM | POA: Diagnosis not present

## 2023-10-19 DIAGNOSIS — F4323 Adjustment disorder with mixed anxiety and depressed mood: Secondary | ICD-10-CM | POA: Diagnosis not present

## 2023-11-02 DIAGNOSIS — F4323 Adjustment disorder with mixed anxiety and depressed mood: Secondary | ICD-10-CM | POA: Diagnosis not present

## 2023-11-16 DIAGNOSIS — F4323 Adjustment disorder with mixed anxiety and depressed mood: Secondary | ICD-10-CM | POA: Diagnosis not present

## 2023-11-23 DIAGNOSIS — F4323 Adjustment disorder with mixed anxiety and depressed mood: Secondary | ICD-10-CM | POA: Diagnosis not present

## 2023-12-21 DIAGNOSIS — F4323 Adjustment disorder with mixed anxiety and depressed mood: Secondary | ICD-10-CM | POA: Diagnosis not present

## 2024-01-08 ENCOUNTER — Other Ambulatory Visit (HOSPITAL_BASED_OUTPATIENT_CLINIC_OR_DEPARTMENT_OTHER): Payer: Self-pay | Admitting: Cardiovascular Disease

## 2024-01-11 DIAGNOSIS — F4323 Adjustment disorder with mixed anxiety and depressed mood: Secondary | ICD-10-CM | POA: Diagnosis not present

## 2024-01-23 DIAGNOSIS — E7800 Pure hypercholesterolemia, unspecified: Secondary | ICD-10-CM | POA: Diagnosis not present

## 2024-01-23 DIAGNOSIS — J302 Other seasonal allergic rhinitis: Secondary | ICD-10-CM | POA: Diagnosis not present

## 2024-01-23 DIAGNOSIS — Z79899 Other long term (current) drug therapy: Secondary | ICD-10-CM | POA: Diagnosis not present

## 2024-01-23 DIAGNOSIS — K219 Gastro-esophageal reflux disease without esophagitis: Secondary | ICD-10-CM | POA: Diagnosis not present

## 2024-01-23 DIAGNOSIS — I10 Essential (primary) hypertension: Secondary | ICD-10-CM | POA: Diagnosis not present

## 2024-01-23 DIAGNOSIS — Z0000 Encounter for general adult medical examination without abnormal findings: Secondary | ICD-10-CM | POA: Diagnosis not present

## 2024-03-25 ENCOUNTER — Telehealth: Payer: Self-pay | Admitting: Cardiovascular Disease

## 2024-03-25 NOTE — Telephone Encounter (Signed)
 Pt c/o medication issue:  1. Name of Medication:  valsartan -hydrochlorothiazide  (DIOVAN -HCT) 160-25 MG tablet  2. How are you currently taking this medication (dosage and times per day)?  Once daily as prescription  3. Are you having a reaction (difficulty breathing--STAT)?   4. What is your medication issue?   Patient thinks BP medication may need to be modified. She says this morning before medication her BP was 128/86 but after medication was 106/69.

## 2024-03-25 NOTE — Telephone Encounter (Signed)
 Returned call to pt- Patient states she thinks her blood pressure is too low with the medication. She states 128/86 ( before meds) but after meds it is 106/69. She states she has noticed that she has some dizziness with bending and feels off at times. Advised generally not concerned with Low bp as long as you feel okay but due to having symptoms will see if dose can be reduced.

## 2024-03-26 NOTE — Telephone Encounter (Signed)
 On a combo pill so difficult to reduce dosage of one medication without the other. Closest reduced dose is Valsartan /hydrochlorothiazide  160/12.5mg 

## 2024-03-27 MED ORDER — VALSARTAN-HYDROCHLOROTHIAZIDE 160-12.5 MG PO TABS: 1.0000 | ORAL_TABLET | Freq: Every day | ORAL | 3 refills | Status: DC

## 2024-03-27 NOTE — Telephone Encounter (Signed)
 Returned call to pt- recommendations reviewed with patient- she is agreeable to change. Rx sent to preferred pharmacy. Pt will call in two weeks with BP update

## 2024-05-08 DIAGNOSIS — L500 Allergic urticaria: Secondary | ICD-10-CM | POA: Diagnosis not present

## 2024-05-08 DIAGNOSIS — J301 Allergic rhinitis due to pollen: Secondary | ICD-10-CM | POA: Diagnosis not present

## 2024-05-08 DIAGNOSIS — J3081 Allergic rhinitis due to animal (cat) (dog) hair and dander: Secondary | ICD-10-CM | POA: Diagnosis not present

## 2024-05-08 DIAGNOSIS — J3089 Other allergic rhinitis: Secondary | ICD-10-CM | POA: Diagnosis not present

## 2024-06-19 ENCOUNTER — Encounter: Payer: Self-pay | Admitting: Emergency Medicine

## 2024-06-19 ENCOUNTER — Ambulatory Visit
Admission: EM | Admit: 2024-06-19 | Discharge: 2024-06-19 | Disposition: A | Discharge status: Home / Self Care | Attending: Student | Admitting: Student

## 2024-06-19 DIAGNOSIS — K219 Gastro-esophageal reflux disease without esophagitis: Secondary | ICD-10-CM

## 2024-06-19 LAB — POCT URINE DIPSTICK
Bilirubin, UA: NEGATIVE
Blood, UA: NEGATIVE
Glucose, UA: NEGATIVE mg/dL
Ketones, POC UA: NEGATIVE mg/dL
Leukocytes, UA: NEGATIVE
Nitrite, UA: NEGATIVE
POC PROTEIN,UA: NEGATIVE
Spec Grav, UA: 1.02 (ref 1.010–1.025)
Urobilinogen, UA: 2 U/dL — AB
pH, UA: 6 (ref 5.0–8.0)

## 2024-06-19 MED ORDER — LIDOCAINE VISCOUS HCL 2 % MT SOLN
15.0000 mL | Freq: Once | OROMUCOSAL | Status: AC
  Administered 2024-06-19: 15 mL via OROMUCOSAL

## 2024-06-19 MED ORDER — ALUM & MAG HYDROXIDE-SIMETH 200-200-20 MG/5ML PO SUSP
30.0000 mL | Freq: Once | ORAL | Status: AC
  Administered 2024-06-19: 30 mL via ORAL

## 2024-06-19 MED ORDER — LANSOPRAZOLE 30 MG PO CPDR: 30.0000 mg | DELAYED_RELEASE_CAPSULE | Freq: Every day | ORAL | 0 refills | Status: AC

## 2024-06-19 NOTE — ED Provider Notes (Signed)
 [facility]    CSN: [id number] Arrival date & time: 06/19/24  9177      History   Chief Complaint Chief Complaint  Patient presents with   Abdominal Pain    HPI Alexis Clarke is a 48 y.o. female presenting with 4 days of abdominal pain.  The symptoms began on 10/29, and felt like gas.  This happened again on the following 2 days.  Today, she notes the addition of epigastric abdominal pain that feels like burning.  The symptoms are similar to when she had H. pylori about 15 years ago, this was managed by a primary care provider at that time.  Regarding the current symptoms, she has taken Gas-X and Pepcid, and this has provided her with temporary relief.  She denies nausea or vomiting.  Notes frequent normal bowel movements, the last was this morning.  Denies hematemesis, hematochezia, melena.  Denies history of abdominal surgery.  Denies urinary symptoms.  HPI  Past Medical History:  Diagnosis Date   Essential hypertension 01/27/2022   Hypertension    Morbid obesity (HCC) 01/27/2022   Pure hypercholesterolemia 06/05/2022   Sinus tachycardia 01/27/2022    Patient Active Problem List   Diagnosis Date Noted   Pure hypercholesterolemia 06/05/2022   Allergic rhinitis due to animal (cat) (dog) hair and dander 03/01/2022   Allergic rhinitis due to pollen 03/01/2022   Allergic urticaria 03/01/2022   Chronic allergic conjunctivitis 03/01/2022   Essential hypertension 01/27/2022   Morbid obesity (HCC) 01/27/2022   Sinus tachycardia 01/27/2022    Past Surgical History:  Procedure Laterality Date   TMJ ARTHROPLASTY      OB History   No obstetric history on file.      Home Medications    Prior to Admission medications   Medication Sig Start Date End Date Taking? Authorizing Provider  lansoprazole (PREVACID) 30 MG capsule Take 1 capsule (30 mg total) by mouth daily at 12 noon for 14 days. 06/19/24 07/03/24 Yes Aralynn Brake E, PA-C  cetirizine (ZYRTEC) 10 MG  tablet Take 1 tablet by mouth as needed.    [provider]  EPINEPHrine (EPIPEN 2-PAK) 0.3 mg/0.3 mL IJ SOAJ injection See admin instructions.    [provider]  fluticasone (FLONASE SENSIMIST) 27.5 MCG/SPRAY nasal spray 2 sprays each nostril 02/07/18   [provider]  Olopatadine HCl 0.6 % SOLN Place 2 sprays into both nostrils 2 (two) times daily.    [provider]  QSYMIA 7.5-46 MG CP24 Take 1 capsule by mouth daily. 05/18/23   [provider]  valsartan -hydrochlorothiazide  (DIOVAN  HCT) 160-12.5 MG tablet Take 1 tablet by mouth daily. 03/27/24   Pavero, Lonni, RPH    Family History Family History  Problem Relation Age of Onset   Hyperlipidemia Mother    Hypertension Mother    Hypertension Father     Social History Social History   Tobacco Use   Smoking status: Never   Smokeless tobacco: Never  Substance Use Topics   Alcohol use: Never   Drug use: Never     Allergies   Almond (diagnostic), Other, and Shellfish-derived products   Review of Systems Review of Systems  Gastrointestinal:  Positive for abdominal pain. Negative for constipation, diarrhea, nausea and vomiting.     Physical Exam Triage Vital Signs ED Triage Vitals  Encounter Vitals Group     BP      Girls Systolic BP Percentile      Girls Diastolic BP Percentile  Boys Systolic BP Percentile      Boys Diastolic BP Percentile      Pulse      Resp      Temp      Temp src      SpO2      Weight      Height      Head Circumference      Peak Flow      Pain Score      Pain Loc      Pain Education      Exclude from Growth Chart    No data found.  Updated Vital Signs BP (!) 138/94 (BP Location: Right Arm)   Pulse 99   Temp 98 F (36.7 C) (Oral)   Resp 17   SpO2 98%   Visual Acuity Right Eye Distance:   Left Eye Distance:   Bilateral Distance:    Right Eye Near:   Left Eye Near:    Bilateral Near:     Physical Exam Vitals reviewed.   Constitutional:      General: She is not in acute distress.    Appearance: Normal appearance. She is not ill-appearing.  HENT:     Head: Normocephalic and atraumatic.     Mouth/Throat:     Mouth: Mucous membranes are moist.     Comments: Moist mucous membranes Eyes:     Extraocular Movements: Extraocular movements intact.     Pupils: Pupils are equal, round, and reactive to light.  Cardiovascular:     Rate and Rhythm: Normal rate and regular rhythm.     Heart sounds: Normal heart sounds.  Pulmonary:     Effort: Pulmonary effort is normal.     Breath sounds: Normal breath sounds. No wheezing, rhonchi or rales.  Abdominal:     General: Bowel sounds are normal. There is no distension.     Palpations: Abdomen is soft. There is no mass.     Tenderness: There is abdominal tenderness in the epigastric area. There is no right CVA tenderness, left CVA tenderness, guarding or rebound.     Comments: Increased bowel sounds throughout.  Epigastric tenderness to palpation.  There is no guarding, no rebound.  Negative Murphy sign, negative Rovsing.  Skin:    General: Skin is warm.     Capillary Refill: Capillary refill takes less than 2 seconds.     Comments: Good skin turgor  Neurological:     General: No focal deficit present.     Mental Status: She is alert and oriented to person, place, and time.  Psychiatric:        Mood and Affect: Mood normal.        Behavior: Behavior normal.      UC Treatments / Results  Labs (all labs ordered are listed, but only abnormal results are displayed) Labs Reviewed  POCT URINE DIPSTICK - Abnormal; Notable for the following components:      Result Value   Urobilinogen, UA 2.0 (*)    All other components within normal limits    EKG   Radiology No results found.  Procedures Procedures (including critical care time)  Medications Ordered in UC Medications  alum & mag hydroxide-simeth (MAALOX/MYLANTA) 200-200-20 MG/5ML suspension 30 mL (30  mLs Oral Given 06/19/24 0853)  lidocaine (XYLOCAINE) 2 % viscous mouth solution 15 mL (15 mLs Mouth/Throat Given 06/19/24 0854)    Initial Impression / Assessment and Plan / UC Course  I have reviewed the triage vital signs and  the nursing notes.  Pertinent labs & imaging results that were available during my care of the patient were reviewed by me and considered in my medical decision making (see chart for details).   Patient with 4 days of epigastric pain, consistent with GERD.  In clinic today, we administered GI cocktail.  After 15 minutes, the patient noted improvement in her symptoms.  She was discharged to home with 14-day course of lansoprazole.  Encouraged her to follow-up with primary care provider, considering history of H. pylori.  Return precautions discussed, including worsening abdominal pain, new symptoms like emesis, hematemesis, hematochezia.  Final Clinical Impressions(s) / UC Diagnoses   Final diagnoses:  Gastroesophageal reflux disease without esophagitis   Discharge Instructions   None    ED Prescriptions     Medication Sig Dispense Auth. Provider   lansoprazole (PREVACID) 30 MG capsule Take 1 capsule (30 mg total) by mouth daily at 12 noon for 14 days. 14 capsule Deionte Spivack E, PA-C      PDMP not reviewed this encounter.   Arlyss Leita BRAVO, PA-C 06/19/24 [phone]

## 2024-06-19 NOTE — Discharge Instructions (Signed)
-   Start the lansoprazole, which you will take at noon daily for 14 days.  I recommend taking this about 30 minutes prior to eating. - You can continue over-the-counter medications like Gas-X, Pepcid. - Follow-up with your primary care provider, particularly if symptoms persist, to also rule out H. pylori. -Seek additional medical attention if your symptoms change or get worse, like worsening abdominal pain, or new symptoms like vomiting.

## 2024-06-19 NOTE — ED Triage Notes (Addendum)
 Pt states Monday she had abdominal pain which felt like gas and she took Pepcid which helped. The same thing happened Tuesday. Wednesday she had midline sharp abdominal  pains which has been persistent.   Denies diarrhea but has had lots of bowel movements.  Pt has hx of GERD and H pylori.

## 2024-06-30 DIAGNOSIS — Z1231 Encounter for screening mammogram for malignant neoplasm of breast: Secondary | ICD-10-CM | POA: Diagnosis not present

## 2024-07-10 DIAGNOSIS — Z01419 Encounter for gynecological examination (general) (routine) without abnormal findings: Secondary | ICD-10-CM | POA: Diagnosis not present

## 2024-09-23 ENCOUNTER — Telehealth: Payer: Self-pay | Admitting: Cardiovascular Disease

## 2024-09-23 NOTE — Telephone Encounter (Signed)
" °*  STAT* If patient is at the pharmacy, call can be transferred to refill team.   1. Which medications need to be refilled? (please list name of each medication and dose if known) valsartan -hydrochlorothiazide  (DIOVAN  HCT) 160-12.5 MG tablet   2. Which pharmacy/location (including street and city if local pharmacy) is medication to be sent to? [id number] - [place], [place] - [address]   3. Do they need a 30 day or 90 day supply? 90  Patient has appt on 10/10/24  "

## 2024-09-26 MED ORDER — VALSARTAN-HYDROCHLOROTHIAZIDE 160-12.5 MG PO TABS: 1.0000 | ORAL_TABLET | Freq: Every day | ORAL | 0 refills | Status: AC

## 2024-09-26 NOTE — Telephone Encounter (Signed)
 Refill has been sent to the pharmacy.

## 2024-10-10 ENCOUNTER — Ambulatory Visit (HOSPITAL_BASED_OUTPATIENT_CLINIC_OR_DEPARTMENT_OTHER): Admitting: Family

## 2024-10-10 ENCOUNTER — Encounter (HOSPITAL_BASED_OUTPATIENT_CLINIC_OR_DEPARTMENT_OTHER): Payer: Self-pay | Admitting: Family

## 2024-10-10 VITALS — BP 138/88 | HR 93 | Ht 62.0 in | Wt 239.4 lb

## 2024-10-10 DIAGNOSIS — E782 Mixed hyperlipidemia: Secondary | ICD-10-CM | POA: Diagnosis not present

## 2024-10-10 DIAGNOSIS — I10 Essential (primary) hypertension: Secondary | ICD-10-CM

## 2024-10-10 MED ORDER — HYDROCHLOROTHIAZIDE 12.5 MG PO CAPS: 12.5000 mg | ORAL_CAPSULE | Freq: Every day | ORAL | 1 refills | Status: AC

## 2024-10-10 NOTE — Patient Instructions (Signed)
 Medication Instructions:   Start taking hydrochlorothiazide  12.5 mg by mouth daily  *If you need a refill on your cardiac medications before your next appointment, please call your pharmacy*  Lab Work:  Return for lab work in 2 weeks (around 10/23/24) for BMET--our lab corp is on the 3rd floor at [address] [facility] if you choose to use this lab  If you have labs (blood work) drawn today and your tests are completely normal, you will receive your results only by: MyChart Message (if you have MyChart) OR A paper copy in the mail If you have any lab test that is abnormal or we need to change your treatment, we will call you to review the results.   Follow-Up: At [facility], you and your health needs are our priority.  As part of our continuing mission to provide you with exceptional heart care, our providers are all part of one team.  This team includes your primary Cardiologist (physician) and Advanced Practice Providers or APPs (Physician Assistants and Nurse Practitioners) who all work together to provide you with the care you need, when you need it.  Your next appointment:   1 year(s)  Provider:   Annabella Scarce, MD, Rosaline Bane, NP, or Reche Finder, NP

## 2024-10-10 NOTE — Progress Notes (Signed)
 " Cardiology Office Note   Date:  10/10/2024  ID:  Alexis Clarke, DOB [birth year], MRN [id number] PCP: Emi Shuck, FNP  [facility] Providers Cardiologist:  None     History of Present Illness [name] is a 49 y.o. female with history of hypertension, morbid obesity, HLD.   She initially established with Dr. [facility]ord [facility] after referral from her OBGYN Dr. Rutherford.   Coronary calcium score 09/2022 of 0.   At visit 01/11/2023.  She was no longer taking amlodipine  with BPs 130s over 80s.  She was started on valsartan -hydrochlorothiazide  160-25 mg daily. She was advised lifestyle changes for lipid control as ASCVD 10-year risk did not warrant medication.  At visit 07/13/23 PCP had initiated Qsymia for weight loss. Her BP was controlled on Valsartan -hydrochlorothiazide .  Via phone call 03/25/24 due to hypotension regimen reduced to valsartan -hydrochlorothiazide  160-12.5mg  daily.  Presents today for follow up. Feeling well since last seen. BP at home has been 140s/90s since reducing dose of Valsartan -hydrochlorothiazide . With higher blood pressure her hands and her ankles are a little bit swollen. Exercising regularly with walking regimen and at the gym with strength training. Reports no shortness of breath nor dyspnea on exertion. Reports no chest pain, pressure, or tightness. No edema, orthopnea, PND.   Previous antihypertensive Amlodipine  - swelling  ROS: Please see the history of present illness.    All other systems reviewed and are negative.   Studies Reviewed EKG Interpretation Date/Time:  Friday October 10 2024 08:33:19 EST Ventricular Rate:  98 PR Interval:  142 QRS Duration:  84 QT Interval:  352 QTC Calculation: 450 R Axis:   23  Text Interpretation: Normal sinus rhythm Nonspecific T wave abnormality now evident in Lateral leads Confirmed by Vannie Mora ([id number]) on 10/10/2024 8:48:12 AM    Cardiac Studies & Procedures    ______________________________________________________________________________________________          CT SCANS  CT CARDIAC SCORING (SELF PAY ONLY) 09/27/2022  Addendum 09/27/2022  2:24 PM ADDENDUM REPORT: 09/27/2022 14:22  CLINICAL DATA:  Cardiovascular Disease Risk stratification  EXAM: Coronary Calcium Score  TECHNIQUE: A gated, non-contrast computed tomography scan of the heart was performed using 3mm slice thickness. Axial images were analyzed on a dedicated workstation. Calcium scoring of the coronary arteries was performed using the Agatston method.  FINDINGS: Coronary Calcium Score:  Left main: 0  Left anterior descending artery: 0  Left circumflex artery: 0  Right coronary artery: 0  Total: 0  Percentile: NA  Pericardium: Normal.  Non-cardiac: See separate report from [facility].  IMPRESSION: 1. Coronary calcium score of 0.  RECOMMENDATIONS: Coronary artery calcium (CAC) score is a strong predictor of incident coronary heart disease (CHD) and provides predictive information beyond traditional risk factors. CAC scoring is reasonable to use in the decision to withhold, postpone, or initiate statin therapy in intermediate-risk or selected borderline-risk asymptomatic adults (age 3-75 years and LDL-C >=70 to <190 mg/dL) who do not have diabetes or established atherosclerotic cardiovascular disease (ASCVD).* In intermediate-risk (10-year ASCVD risk >=7.5% to <20%) adults or selected borderline-risk (10-year ASCVD risk >=5% to <7.5%) adults in whom a CAC score is measured for the purpose of making a treatment decision the following recommendations have been made:  If CAC=0, it is reasonable to withhold statin therapy and reassess in 5 to 10 years, as long as higher risk conditions are absent (diabetes mellitus, family history of premature CHD in first degree relatives (males <55 years; females <65 years),  cigarette smoking, or LDL >=190  mg/dL).  If CAC is 1 to 99, it is reasonable to initiate statin therapy for patients >=13 years of age.  If CAC is >=100 or >=75th percentile, it is reasonable to initiate statin therapy at any age.  Cardiology referral should be considered for patients with CAC scores >=400 or >=75th percentile.  *2018 AHA/ACC/AACVPR/AAPA/ABC/ACPM/ADA/AGS/APhA/ASPC/NLA/PCNA Guideline on the Management of Blood Cholesterol: A Report of the American College of Cardiology/American Heart Association Task Force on Clinical Practice Guidelines. J Am Coll Cardiol. 2019;73(24):3168-3209.  Alexis Decent, MD   Electronically Signed By: Alexis Clarke M.D. On: 09/27/2022 14:22  Narrative EXAM: OVER-READ INTERPRETATION  CT CHEST  The following report is an over-read performed by radiologist Dr. Marcey Moan of [facility] on 09/27/2022. This over-read does not include interpretation of cardiac or coronary anatomy or pathology. The coronary calcium score interpretation by the cardiologist is attached.  COMPARISON:  None Available.  FINDINGS: Vascular: No significant noncardiac vascular findings.  Mediastinum/Nodes: Visualized mediastinum and hilar regions demonstrate no lymphadenopathy or masses.  Lungs/Pleura: There is no evidence of pulmonary edema, consolidation, pneumothorax, nodule or pleural fluid.  Upper Abdomen: No acute abnormality.  Musculoskeletal: No chest wall mass or suspicious bone lesions identified.  IMPRESSION: No significant incidental extracardiac findings.  Electronically Signed: By: Marcey Moan M.D. On: 09/27/2022 08:34     ______________________________________________________________________________________________      Risk Assessment/Calculations           Physical Exam VS:  BP 138/88   Pulse 93   Ht 5' 2 (1.575 m)   Wt 239 lb 6.4 oz (108.6 kg)   SpO2 97%   BMI 43.79 kg/m        Wt Readings from Last 3 Encounters:  10/10/24  239 lb 6.4 oz (108.6 kg)  07/13/23 228 lb (103.4 kg)  01/11/23 244 lb (110.7 kg)    GEN: Well nourished, well developed in no acute distress, overweight NECK: No JVD; No carotid bruits CARDIAC: RRR, no murmurs, rubs, gallops RESPIRATORY:  Clear to auscultation without rales, wheezing or rhonchi  ABDOMEN: Soft, non-tender, non-distended EXTREMITIES:  No edema; No deformity   ASSESSMENT AND PLAN  HTN - BP not at goal <130/80. Continue valsartan -hydrochlorothiazide  160-12.5mg  daily. Add hydrochlorothiazide  12.5mg  daily. BMP in 2 weeks. If BP controlled will plan to switch to valsartan -hydrochlorothiazide  320-25mg  daily (she preferred to wait to do so as recently filled her combo tab).Discussed to monitor BP at home at least 2 hours after medications and sitting for 5-10 minutes.   HLD - 01/2024 LDL 140. 10 year ASCVD risk 7% which is borderline. Discussed lifestyle changes to lower cholesterol, resources provided. Does not require lipid lowering therapy at this time.  Morbid obesity - Weight loss via diet and exercise encouraged. Discussed the impact being overweight would have on cardiovascular risk. Congratulated on exercising regularly. Upcoming visit to establish with [facility].       Dispo: follow up in 1 year  Signed, Reche GORMAN Finder, NP   "
